# Patient Record
Sex: Male | Born: 1990 | Race: Black or African American | Hispanic: No | Smoking: Current every day smoker
Health system: Southern US, Community
[De-identification: ages and names within clinical notes are randomized; demographics above are authoritative.]

## PROBLEM LIST (undated history)

## (undated) DIAGNOSIS — R51 Headache: Secondary | ICD-10-CM

## (undated) DIAGNOSIS — D759 Disease of blood and blood-forming organs, unspecified: Secondary | ICD-10-CM

## (undated) DIAGNOSIS — R569 Unspecified convulsions: Secondary | ICD-10-CM

## (undated) HISTORY — PX: HERNIA REPAIR: SHX51

---

## 2010-09-26 ENCOUNTER — Emergency Department (HOSPITAL_COMMUNITY)
Admission: EM | Admit: 2010-09-26 | Discharge: 2010-09-26 | Disposition: A | Discharge status: Home / Self Care | Payer: Self-pay | Attending: Emergency Medicine | Admitting: Emergency Medicine

## 2010-09-26 ENCOUNTER — Emergency Department (HOSPITAL_COMMUNITY): Payer: Self-pay

## 2010-09-26 DIAGNOSIS — S60219A Contusion of unspecified wrist, initial encounter: Secondary | ICD-10-CM | POA: Insufficient documentation

## 2010-09-26 DIAGNOSIS — W2209XA Striking against other stationary object, initial encounter: Secondary | ICD-10-CM | POA: Insufficient documentation

## 2013-10-05 ENCOUNTER — Encounter (HOSPITAL_COMMUNITY): Payer: Self-pay | Admitting: Emergency Medicine

## 2013-10-05 ENCOUNTER — Emergency Department (HOSPITAL_COMMUNITY)
Admission: EM | Admit: 2013-10-05 | Discharge: 2013-10-06 | Disposition: A | Discharge status: Home / Self Care | Payer: Self-pay | Attending: Emergency Medicine | Admitting: Emergency Medicine

## 2013-10-05 ENCOUNTER — Emergency Department (HOSPITAL_COMMUNITY): Payer: Self-pay

## 2013-10-05 DIAGNOSIS — IMO0002 Reserved for concepts with insufficient information to code with codable children: Secondary | ICD-10-CM | POA: Insufficient documentation

## 2013-10-05 DIAGNOSIS — S61509A Unspecified open wound of unspecified wrist, initial encounter: Secondary | ICD-10-CM | POA: Insufficient documentation

## 2013-10-05 DIAGNOSIS — Y9289 Other specified places as the place of occurrence of the external cause: Secondary | ICD-10-CM | POA: Insufficient documentation

## 2013-10-05 DIAGNOSIS — S61409A Unspecified open wound of unspecified hand, initial encounter: Secondary | ICD-10-CM | POA: Insufficient documentation

## 2013-10-05 DIAGNOSIS — F17200 Nicotine dependence, unspecified, uncomplicated: Secondary | ICD-10-CM | POA: Insufficient documentation

## 2013-10-05 DIAGNOSIS — Y9389 Activity, other specified: Secondary | ICD-10-CM | POA: Insufficient documentation

## 2013-10-05 DIAGNOSIS — S61411A Laceration without foreign body of right hand, initial encounter: Secondary | ICD-10-CM

## 2013-10-05 DIAGNOSIS — Z880 Allergy status to penicillin: Secondary | ICD-10-CM | POA: Insufficient documentation

## 2013-10-05 MED ORDER — FENTANYL CITRATE 0.05 MG/ML IJ SOLN
INTRAMUSCULAR | Status: AC
  Administered 2013-10-05: 50 ug
  Filled 2013-10-05: qty 2

## 2013-10-05 MED ORDER — CEFAZOLIN SODIUM 1-5 GM-% IV SOLN
INTRAVENOUS | Status: AC
  Administered 2013-10-05: 1000 mg
  Filled 2013-10-05: qty 50

## 2013-10-05 MED ORDER — LIDOCAINE HCL (PF) 1 % IJ SOLN
INTRAMUSCULAR | Status: AC
  Administered 2013-10-05: 5 mL
  Filled 2013-10-05: qty 10

## 2013-10-05 MED ORDER — LIDOCAINE HCL (PF) 1 % IJ SOLN
INTRAMUSCULAR | Status: AC
  Administered 2013-10-05: 5 mL
  Filled 2013-10-05: qty 5

## 2013-10-05 NOTE — ED Notes (Signed)
No bleeding noted through gauze dressing at this time.

## 2013-10-05 NOTE — ED Notes (Signed)
Patient has laceration noted to right palm of hand and right wrist. Bleeding from site at this time. Pressure applied to areas to control bleeding. Dr Adriana Simasook at bedside.

## 2013-10-05 NOTE — ED Notes (Signed)
Pt's rt hand went through glass. Bleeding controlled at this time.

## 2013-10-05 NOTE — ED Provider Notes (Signed)
CSN: [id number]     Arrival date & time 10/05/13  2111 History  This chart was scribed for Donnetta HutchingBrian Tykeisha Peer, MD by Blanchard KelchNicole Curnes, ED Scribe. The patient was seen in room APA01/APA01. Patient's care was started at 9:54 PM.    Chief Complaint  Patient presents with  . Extremity Laceration     Patient is a 23 y.o. male presenting with skin laceration. The history is provided by the patient. No language interpreter was used.  Laceration   HPI Comments:Level 5 caveat for urgent need for intervention Tanor Lanier PrudeBolden is a 23 y.o. male who presents to the Emergency Department due to multiple lacerations on his right hand and wrist that occurred after he punched through a glass cabinet. The lacerations are actively bleeding with some areas gushing. He has moderate, constant pain to the affected hand. He reports normal ROM in his right thumb. He denies numbness. He denies any past pertinent medical history. He is up to date on his tetanus vaccination.   History reviewed. No pertinent past medical history. Past Surgical History  Procedure Laterality Date  . Hernia repair     History reviewed. No pertinent family history. History  Substance Use Topics  . Smoking status: Current Every Day Smoker    Types: Cigarettes  . Smokeless tobacco: Not on file  . Alcohol Use: Yes    Review of Systems  Unable to perform ROS: Acuity of condition   A complete 10 system review of systems was obtained and all systems are negative except as noted in the HPI and PMH.     Allergies  Penicillins  Home Medications  No current outpatient prescriptions on file. Triage Vitals: BP 124/75  Pulse 95  Temp(Src) 98.3 F (36.8 C)  Resp 18  Ht 5\' 9"  (1.753 m)  Wt 170 lb (77.111 kg)  BMI 25.09 kg/m2  SpO2 97%  Physical Exam  Nursing note and vitals reviewed. Constitutional: He is oriented to person, place, and time. He appears well-developed and well-nourished. No distress.  HENT:  Head: Normocephalic and  atraumatic.  Eyes: EOM are normal.  Neck: Neck supple. No tracheal deviation present.  Cardiovascular: Normal rate.   Pulmonary/Chest: Effort normal. No respiratory distress.  Musculoskeletal:  Right hand: 3 lacerations on the palmar aspect of hand and wrist.  Gross neurovascular intact.  Neurological: He is alert and oriented to person, place, and time.  Skin: Skin is warm and dry.  Psychiatric: He has a normal mood and affect. His behavior is normal.    ED Course  Procedures (including critical care time)  DIAGNOSTIC STUDIES: Oxygen Saturation is 97% on room air, adequate by my interpretation.    COORDINATION OF CARE: 9:57 PM -Will repair lacerations. Patient verbalizes understanding and agrees with treatment plan.  LACERATION REPAIR PROCEDURE NOTE The patient's identification was confirmed and consent was obtained. This procedure was performed by Donnetta HutchingBrian Candiss Galeana, MD at 10:00 PM. Site: right hand Sterile procedures observed Anesthetic used (type and amt): 1% xylo 10 cc Suture type/size:3-0 prolene, 4-0 nylon Length:  3 lacerations;   Total length 8.0 cm # of Sutures: uncertain Technique:simple interrupted Complexity:  complex Antibx ointment applied:  none Tetanus UTD Site anesthetized, irrigated with NS, explored without evidence of foreign body, wound well approximated, site covered with dry, sterile dressing.  Patient tolerated procedure well without complications. Instructions for care discussed verbally and patient provided with additional written instructions for homecare and f/u.  Wound was gushing blood.  Pressure applied manually and with tourniquet.  Sutures applied to control bleeding.   Labs Review Labs Reviewed - No data to display Imaging Review Dg Hand 2 View Right  10/05/2013   CLINICAL DATA:  Laceration right hand.  EXAM: RIGHT HAND - 2 VIEW  COMPARISON:  None.  FINDINGS: Multiple skin defects are seen about the base of the first metacarpal and distal  radius consistent with lacerations. No fracture, foreign body or dislocation is identified.  IMPRESSION: Lacerations without fracture or foreign body.   Electronically Signed   By: Drusilla Kanner M.D.   On: 10/05/2013 22:28     EKG Interpretation None     CRITICAL CARE Performed by: Donnetta Hutching Total critical care time:45 Critical care time was exclusive of separately billable procedures and treating other patients. Critical care was necessary to treat or prevent imminent or life-threatening deterioration. Critical care was time spent personally by me on the following activities: development of treatment plan with patient and/or surrogate as well as nursing, discussions with consultants, evaluation of patient's response to treatment, examination of patient, obtaining history from patient or surrogate, ordering and performing treatments and interventions, ordering and review of laboratory studies, ordering and review of radiographic studies, pulse oximetry and re-evaluation of patient's condition. MDM   Final diagnoses:  None    Patient was spurting blood from his hand laceration. Tourniquet and pressure applied. Gross neurological exam normal. Pain medicine, IV Ancef, local infiltration with Xylocaine.  Repair with Prolene and nylon suture.  Discussed with Dr. Melvyn Novas.   He will recheck patient on Friday.  I personally performed the services described in this documentation, which was scribed in my presence. The recorded information has been reviewed and is accurate.    Donnetta Hutching, MD 10/06/13 [phone]

## 2013-10-05 NOTE — ED Notes (Signed)
Patient hand and wrist bandaged with xeroform with vaseline and sterile gauze, wrapped with sterile dressing by Dr. Adriana Simasook. Right hand and arm kept elevated above the head to control bleeding.

## 2013-10-06 MED ORDER — PROMETHAZINE HCL 25 MG PO TABS: 25.0000 mg | ORAL_TABLET | Freq: Four times a day (QID) | ORAL | Status: DC | PRN

## 2013-10-06 MED ORDER — MORPHINE SULFATE 4 MG/ML IJ SOLN
4.0000 mg | Freq: Once | INTRAMUSCULAR | Status: AC
  Administered 2013-10-06: 4 mg via INTRAVENOUS
  Filled 2013-10-06: qty 1

## 2013-10-06 MED ORDER — ONDANSETRON HCL 4 MG/2ML IJ SOLN
4.0000 mg | Freq: Once | INTRAMUSCULAR | Status: AC
  Administered 2013-10-06: 4 mg via INTRAVENOUS
  Filled 2013-10-06: qty 2

## 2013-10-06 MED ORDER — GI COCKTAIL ~~LOC~~
ORAL | Status: AC
  Filled 2013-10-06: qty 30

## 2013-10-06 MED ORDER — OXYCODONE-ACETAMINOPHEN 5-325 MG PO TABS: 2.0000 | ORAL_TABLET | ORAL | Status: DC | PRN

## 2013-10-06 MED ORDER — OXYCODONE-ACETAMINOPHEN 5-325 MG PO TABS
2.0000 | ORAL_TABLET | ORAL | Status: DC | PRN
Start: 2013-10-06 — End: 2013-10-10

## 2013-10-06 NOTE — Discharge Instructions (Signed)
Keep hand elevated. Prescriptions for pain and nausea medicine. Keep dressing on. Call the orthopedic Dr. tomorrow for an appointment Friday. Remind the office staff that I spoke to the Hydrographic surveyorhand surgeon.

## 2013-10-10 ENCOUNTER — Encounter (HOSPITAL_BASED_OUTPATIENT_CLINIC_OR_DEPARTMENT_OTHER): Payer: Self-pay | Admitting: *Deleted

## 2013-10-12 ENCOUNTER — Ambulatory Visit (HOSPITAL_BASED_OUTPATIENT_CLINIC_OR_DEPARTMENT_OTHER): Admission: RE | Admit: 2013-10-12 | Payer: Self-pay | Source: Ambulatory Visit | Admitting: Orthopedic Surgery

## 2013-10-12 ENCOUNTER — Encounter (HOSPITAL_COMMUNITY): Payer: Self-pay | Admitting: *Deleted

## 2013-10-12 HISTORY — DX: Headache: R51

## 2013-10-12 HISTORY — DX: Disease of blood and blood-forming organs, unspecified: D75.9

## 2013-10-12 SURGERY — NERVE AND TENDON REPAIR
Anesthesia: General | Laterality: Right

## 2013-10-12 MED ORDER — MIDAZOLAM HCL 2 MG/2ML IJ SOLN: 1.0000 mg | INTRAMUSCULAR | Status: DC | PRN

## 2013-10-12 MED ORDER — LACTATED RINGERS IV SOLN: INTRAVENOUS | Status: DC

## 2013-10-12 MED ORDER — FENTANYL CITRATE 0.05 MG/ML IJ SOLN: 50.0000 ug | INTRAMUSCULAR | Status: DC | PRN

## 2013-10-13 ENCOUNTER — Ambulatory Visit (HOSPITAL_COMMUNITY): Payer: Self-pay | Admitting: Anesthesiology

## 2013-10-13 ENCOUNTER — Encounter (HOSPITAL_COMMUNITY): Payer: Self-pay | Admitting: Anesthesiology

## 2013-10-13 ENCOUNTER — Ambulatory Visit (HOSPITAL_COMMUNITY)
Admission: RE | Admit: 2013-10-13 | Discharge: 2013-10-13 | Disposition: A | Discharge status: Home / Self Care | Payer: Self-pay | Source: Ambulatory Visit | Attending: Orthopedic Surgery | Admitting: Orthopedic Surgery

## 2013-10-13 ENCOUNTER — Encounter (HOSPITAL_COMMUNITY)
Admission: RE | Disposition: A | Discharge status: Home / Self Care | Payer: Self-pay | Source: Ambulatory Visit | Attending: Orthopedic Surgery

## 2013-10-13 DIAGNOSIS — D573 Sickle-cell trait: Secondary | ICD-10-CM | POA: Insufficient documentation

## 2013-10-13 DIAGNOSIS — S5420XA Injury of radial nerve at forearm level, unspecified arm, initial encounter: Secondary | ICD-10-CM | POA: Insufficient documentation

## 2013-10-13 DIAGNOSIS — S61509A Unspecified open wound of unspecified wrist, initial encounter: Secondary | ICD-10-CM | POA: Insufficient documentation

## 2013-10-13 DIAGNOSIS — S55109A Unspecified injury of radial artery at forearm level, unspecified arm, initial encounter: Secondary | ICD-10-CM | POA: Insufficient documentation

## 2013-10-13 DIAGNOSIS — S66909A Unspecified injury of unspecified muscle, fascia and tendon at wrist and hand level, unspecified hand, initial encounter: Principal | ICD-10-CM | POA: Insufficient documentation

## 2013-10-13 DIAGNOSIS — F17200 Nicotine dependence, unspecified, uncomplicated: Secondary | ICD-10-CM | POA: Insufficient documentation

## 2013-10-13 DIAGNOSIS — S61209A Unspecified open wound of unspecified finger without damage to nail, initial encounter: Secondary | ICD-10-CM | POA: Insufficient documentation

## 2013-10-13 DIAGNOSIS — G43909 Migraine, unspecified, not intractable, without status migrainosus: Secondary | ICD-10-CM | POA: Insufficient documentation

## 2013-10-13 DIAGNOSIS — Y289XXA Contact with unspecified sharp object, undetermined intent, initial encounter: Secondary | ICD-10-CM | POA: Insufficient documentation

## 2013-10-13 HISTORY — PX: NERVE AND TENDON REPAIR: SHX5693

## 2013-10-13 SURGERY — NERVE AND TENDON REPAIR
Anesthesia: General | Laterality: Right

## 2013-10-13 MED ORDER — ARTIFICIAL TEARS OP OINT
TOPICAL_OINTMENT | OPHTHALMIC | Status: DC | PRN
  Administered 2013-10-13: 1 via OPHTHALMIC

## 2013-10-13 MED ORDER — OXYCODONE HCL 5 MG PO TABS
ORAL_TABLET | ORAL | Status: AC
  Filled 2013-10-13: qty 1

## 2013-10-13 MED ORDER — OXYCODONE HCL 5 MG PO TABS
5.0000 mg | ORAL_TABLET | Freq: Once | ORAL | Status: AC | PRN
  Administered 2013-10-13: 5 mg via ORAL

## 2013-10-13 MED ORDER — CLINDAMYCIN PHOSPHATE 900 MG/50ML IV SOLN
INTRAVENOUS | Status: AC
  Administered 2013-10-13: 900 mg via INTRAVENOUS
  Filled 2013-10-13: qty 50

## 2013-10-13 MED ORDER — MIDAZOLAM HCL 2 MG/2ML IJ SOLN
INTRAMUSCULAR | Status: AC
  Filled 2013-10-13: qty 2

## 2013-10-13 MED ORDER — SODIUM CHLORIDE 0.9 % IR SOLN
Status: DC | PRN
  Administered 2013-10-13: 17:00:00

## 2013-10-13 MED ORDER — FENTANYL CITRATE 0.05 MG/ML IJ SOLN
INTRAMUSCULAR | Status: AC
  Filled 2013-10-13: qty 5

## 2013-10-13 MED ORDER — CLINDAMYCIN PHOSPHATE 900 MG/50ML IV SOLN: 900.0000 mg | INTRAVENOUS | Status: DC

## 2013-10-13 MED ORDER — HEPARIN SODIUM (PORCINE) 1000 UNIT/ML IJ SOLN
INTRAMUSCULAR | Status: AC
  Filled 2013-10-13: qty 1

## 2013-10-13 MED ORDER — HYDROMORPHONE HCL PF 1 MG/ML IJ SOLN
INTRAMUSCULAR | Status: AC
  Filled 2013-10-13: qty 1

## 2013-10-13 MED ORDER — LACTATED RINGERS IV SOLN
INTRAVENOUS | Status: DC
  Administered 2013-10-13 (×2): via INTRAVENOUS

## 2013-10-13 MED ORDER — PROMETHAZINE HCL 25 MG/ML IJ SOLN: 6.2500 mg | INTRAMUSCULAR | Status: DC | PRN

## 2013-10-13 MED ORDER — FENTANYL CITRATE 0.05 MG/ML IJ SOLN
INTRAMUSCULAR | Status: DC | PRN
  Administered 2013-10-13 (×6): 50 ug via INTRAVENOUS

## 2013-10-13 MED ORDER — ONDANSETRON HCL 4 MG/2ML IJ SOLN
INTRAMUSCULAR | Status: DC | PRN
  Administered 2013-10-13: 4 mg via INTRAVENOUS

## 2013-10-13 MED ORDER — LIDOCAINE HCL (CARDIAC) 20 MG/ML IV SOLN
INTRAVENOUS | Status: AC
  Filled 2013-10-13: qty 5

## 2013-10-13 MED ORDER — OXYCODONE HCL 5 MG PO TABS: 5.0000 mg | ORAL_TABLET | ORAL | Status: DC | PRN

## 2013-10-13 MED ORDER — BUPIVACAINE HCL (PF) 0.25 % IJ SOLN
INTRAMUSCULAR | Status: DC | PRN
  Administered 2013-10-13: 10 mL

## 2013-10-13 MED ORDER — ONDANSETRON HCL 4 MG/2ML IJ SOLN
INTRAMUSCULAR | Status: AC
  Filled 2013-10-13: qty 2

## 2013-10-13 MED ORDER — MIDAZOLAM HCL 5 MG/5ML IJ SOLN
INTRAMUSCULAR | Status: DC | PRN
  Administered 2013-10-13: 2 mg via INTRAVENOUS

## 2013-10-13 MED ORDER — CHLORHEXIDINE GLUCONATE 4 % EX LIQD
60.0000 mL | Freq: Once | CUTANEOUS | Status: DC
  Filled 2013-10-13: qty 60

## 2013-10-13 MED ORDER — OXYCODONE HCL 5 MG/5ML PO SOLN: 5.0000 mg | Freq: Once | ORAL | Status: AC | PRN

## 2013-10-13 MED ORDER — DOCUSATE SODIUM 100 MG PO CAPS: 100.0000 mg | ORAL_CAPSULE | Freq: Two times a day (BID) | ORAL | Status: DC

## 2013-10-13 MED ORDER — PROPOFOL 10 MG/ML IV BOLUS
INTRAVENOUS | Status: DC | PRN
  Administered 2013-10-13: 200 mg via INTRAVENOUS

## 2013-10-13 MED ORDER — LIDOCAINE HCL (CARDIAC) 20 MG/ML IV SOLN
INTRAVENOUS | Status: DC | PRN
  Administered 2013-10-13: 100 mg via INTRAVENOUS

## 2013-10-13 MED ORDER — BUPIVACAINE HCL (PF) 0.25 % IJ SOLN
INTRAMUSCULAR | Status: AC
  Filled 2013-10-13: qty 30

## 2013-10-13 MED ORDER — HYDROMORPHONE HCL PF 1 MG/ML IJ SOLN
0.2500 mg | INTRAMUSCULAR | Status: DC | PRN
  Administered 2013-10-13: 0.5 mg via INTRAVENOUS

## 2013-10-13 MED ORDER — ARTIFICIAL TEARS OP OINT
TOPICAL_OINTMENT | OPHTHALMIC | Status: AC
  Filled 2013-10-13: qty 3.5

## 2013-10-13 MED FILL — Oxycodone w/ Acetaminophen Tab 5-325 MG: ORAL | Qty: 6 | Status: AC

## 2013-10-13 SURGICAL SUPPLY — 63 items
BAG DECANTER FOR FLEXI CONT (MISCELLANEOUS) ×3 IMPLANT
BANDAGE ELASTIC 3 VELCRO ST LF (GAUZE/BANDAGES/DRESSINGS) ×3 IMPLANT
BANDAGE ELASTIC 4 VELCRO ST LF (GAUZE/BANDAGES/DRESSINGS) ×3 IMPLANT
BANDAGE GAUZE ELAST BULKY 4 IN (GAUZE/BANDAGES/DRESSINGS) ×3 IMPLANT
BLADE SURG 15 STRL LF DISP TIS (BLADE) IMPLANT
BLADE SURG 15 STRL SS (BLADE)
BNDG ESMARK 4X9 LF (GAUZE/BANDAGES/DRESSINGS) ×3 IMPLANT
BNDG GAUZE ELAST 4 BULKY (GAUZE/BANDAGES/DRESSINGS) ×3 IMPLANT
CORDS BIPOLAR (ELECTRODE) ×3 IMPLANT
COVER MAYO STAND STRL (DRAPES) ×3 IMPLANT
CUFF TOURNIQUET SINGLE 18IN (TOURNIQUET CUFF) IMPLANT
DRAPE SURG 17X23 STRL (DRAPES) ×3 IMPLANT
DRSG ADAPTIC 3X8 NADH LF (GAUZE/BANDAGES/DRESSINGS) ×3 IMPLANT
DRSG EMULSION OIL 3X3 NADH (GAUZE/BANDAGES/DRESSINGS) ×3 IMPLANT
GAUZE SPONGE 4X4 16PLY XRAY LF (GAUZE/BANDAGES/DRESSINGS) ×3 IMPLANT
GLOVE BIOGEL PI IND STRL 8 (GLOVE) ×1 IMPLANT
GLOVE BIOGEL PI IND STRL 8.5 (GLOVE) ×1 IMPLANT
GLOVE BIOGEL PI INDICATOR 8 (GLOVE) ×2
GLOVE BIOGEL PI INDICATOR 8.5 (GLOVE) ×2
GLOVE BIOGEL PI ORTHO PRO SZ8 (GLOVE) ×2
GLOVE PI ORTHO PRO STRL SZ8 (GLOVE) ×1 IMPLANT
GLOVE SURG ORTHO 8.0 STRL STRW (GLOVE) ×3 IMPLANT
GOWN STRL REUS W/ TWL XL LVL3 (GOWN DISPOSABLE) ×1 IMPLANT
GOWN STRL REUS W/TWL 2XL LVL3 (GOWN DISPOSABLE) ×3 IMPLANT
GOWN STRL REUS W/TWL LRG LVL4 (GOWN DISPOSABLE) ×3 IMPLANT
GOWN STRL REUS W/TWL XL LVL3 (GOWN DISPOSABLE) ×2
KIT BASIN OR (CUSTOM PROCEDURE TRAY) ×3 IMPLANT
LOOP VESSEL MAXI BLUE (MISCELLANEOUS) IMPLANT
LOOP VESSEL MINI RED (MISCELLANEOUS) IMPLANT
NEEDLE HYPO 25GX1X1/2 BEV (NEEDLE) ×3 IMPLANT
NEEDLE HYPO 25X1 1.5 SAFETY (NEEDLE) IMPLANT
NERVE PROTECTOR NEURAWRAP 3MM (Tissue) ×2 IMPLANT
NS IRRIG 1000ML POUR BTL (IV SOLUTION) ×3 IMPLANT
PACK ORTHO EXTREMITY (CUSTOM PROCEDURE TRAY) ×3 IMPLANT
PAD CAST 4YDX4 CTTN HI CHSV (CAST SUPPLIES) ×2 IMPLANT
PADDING CAST ABS 4INX4YD NS (CAST SUPPLIES) ×2
PADDING CAST ABS COTTON 4X4 ST (CAST SUPPLIES) ×1 IMPLANT
PADDING CAST COTTON 4X4 STRL (CAST SUPPLIES) ×4
PROTECTOR NRV 4X32 PEEL NERWRP (Tissue) ×1 IMPLANT
PRTC NRV 4X32 PEEL NEURAWRAP (Tissue) ×1 IMPLANT
SPEAR EYE SURG WECK-CEL (MISCELLANEOUS) IMPLANT
SPLINT FIBERGLASS 4X30 (CAST SUPPLIES) ×3 IMPLANT
SPONGE GAUZE 4X4 12PLY (GAUZE/BANDAGES/DRESSINGS) ×3 IMPLANT
SUCTION FRAZIER TIP 10 FR DISP (SUCTIONS) IMPLANT
SUT ETHIBOND 3-0 V-5 (SUTURE) IMPLANT
SUT ETHILON 4 0 PS 2 18 (SUTURE) IMPLANT
SUT ETHILON 7 0 P 6 18 (SUTURE) ×3 IMPLANT
SUT ETHILON 8 0 TG100 8 (SUTURE) ×3 IMPLANT
SUT MERSILENE 4 0 P 3 (SUTURE) IMPLANT
SUT PROLENE 3 0 PS 2 (SUTURE) ×9 IMPLANT
SUT PROLENE 4 0 PS 2 18 (SUTURE) IMPLANT
SUT PROLENE 7 0 DA (SUTURE) ×3 IMPLANT
SUT VIC AB 2-0 SH 27 (SUTURE)
SUT VIC AB 2-0 SH 27XBRD (SUTURE) IMPLANT
SUT VIC AB 3-0 PS2 18 (SUTURE) ×6
SUT VIC AB 3-0 PS2 18XBRD (SUTURE) ×3 IMPLANT
SUT VICRYL 4-0 PS2 18IN ABS (SUTURE) ×3 IMPLANT
SUT VICRYL RAPIDE 4/0 PS 2 (SUTURE) ×9 IMPLANT
SYR CONTROL 10ML LL (SYRINGE) ×3 IMPLANT
TUBE CONNECTING 12'X1/4 (SUCTIONS)
TUBE CONNECTING 12X1/4 (SUCTIONS) IMPLANT
UNDERPAD 30X30 INCONTINENT (UNDERPADS AND DIAPERS) ×3 IMPLANT
WATER STERILE IRR 1000ML POUR (IV SOLUTION) ×3 IMPLANT

## 2013-10-13 NOTE — Brief Op Note (Signed)
10/13/2013  4:39 PM  PATIENT:  Tony Hobbs  23 y.o. male  PRE-OPERATIVE DIAGNOSIS:  RIGHT WRIST LACERATION WITH TENDON NERVE INVOLVEMENT  POST-OPERATIVE DIAGNOSIS:  SAME  PROCEDURE:  Procedure(s): RIGHT WRIST/THUMB WOUND EXPLORATION AND REPAIR AS INDICATED (Right)  SURGEON:  Surgeon(s) and Role:    * Sharma CovertFred W Collen Vincent, MD - Primary  PHYSICIAN ASSISTANT:   ASSISTANTS: none   ANESTHESIA:   general  EBL:     BLOOD ADMINISTERED:none  DRAINS: none   LOCAL MEDICATIONS USED:  MARCAINE     SPECIMEN:  No Specimen  DISPOSITION OF SPECIMEN:  N/A  COUNTS:  YES  TOURNIQUET:    DICTATIO: [id number]: [id number]  PLAN OF CARE: Discharge to home after PACU  PATIENT DISPOSITION:  PACU - hemodynamically stable.   Delay start of Pharmacological VTE agent (>24hrs) due to surgical blood loss or risk of bleeding: not applicable

## 2013-10-13 NOTE — Transfer of Care (Signed)
Immediate Anesthesia Transfer of Care Note  Patient: Tony Hobbs  Procedure(s) Performed: Procedure(s): RIGHT WRIST/THUMB WOUND EXPLORATION AND REPAIR AS INDICATED (Right)  Patient Location: PACU  Anesthesia Type:General  Level of Consciousness: awake, alert  and oriented  Airway & Oxygen Therapy: Patient Spontanous Breathing and Patient connected to nasal cannula oxygen  Post-op Assessment: Report given to PACU RN  Post vital signs: Reviewed and stable  Complications: No apparent anesthesia complications

## 2013-10-13 NOTE — Anesthesia Preprocedure Evaluation (Addendum)
Anesthesia Evaluation  Patient identified by MRN, date of birth, ID band Patient awake    Reviewed: Allergy & Precautions, H&P , NPO status , Patient's Chart, lab work & pertinent test results  History of Anesthesia Complications Negative for: history of anesthetic complications  Airway Mallampati: I  Neck ROM: Full    Dental  (+) Teeth Intact, Chipped,    Pulmonary Current Smoker,  breath sounds clear to auscultation        Cardiovascular negative cardio ROS  Rhythm:Regular Rate:Normal     Neuro/Psych    GI/Hepatic negative GI ROS, Neg liver ROS,   Endo/Other    Renal/GU      Musculoskeletal   Abdominal   Peds  Hematology   Anesthesia Other Findings   Reproductive/Obstetrics                          Anesthesia Physical Anesthesia Plan  ASA: II  Anesthesia Plan: General   Post-op Pain Management:    Induction: Intravenous  Airway Management Planned: LMA  Additional Equipment:   Intra-op Plan:   Post-operative Plan: Extubation in OR  Informed Consent: I have reviewed the patients History and Physical, chart, labs and discussed the procedure including the risks, benefits and alternatives for the proposed anesthesia with the patient or authorized representative who has indicated his/her understanding and acceptance.   Dental advisory given  Plan Discussed with: CRNA and Surgeon  Anesthesia Plan Comments:         Anesthesia Quick Evaluation

## 2013-10-13 NOTE — H&P (Signed)
Tony Hobbs is an 23 y.o. male.   Chief Complaint: right wrist laceration HPI: pt with injury to right wrist/ put wrist through glass window Presented to office with open wounds to right hand Pt here for surgery  No previous surgery to right wrist  Past Medical History  Diagnosis Date  . Headache(784.0)     migraines  . Blood dyscrasia     sickle cell trait    Past Surgical History  Procedure Laterality Date  . Hernia repair      right    History reviewed. No pertinent family history. Social History:  reports that he has been smoking Cigarettes.  He has been smoking about 1.00 pack per day. He has never used smokeless tobacco. He reports that he drinks alcohol. He reports that he uses illicit drugs (Marijuana).  Allergies:  Allergies  Allergen Reactions  . Penicillins Rash    Medications Prior to Admission  Medication Sig Dispense Refill  . oxyCODONE-acetaminophen (PERCOCET/ROXICET) 5-325 MG per tablet Take 1 tablet by mouth every 6 (six) hours as needed for moderate pain or severe pain.      . promethazine (PHENERGAN) 25 MG tablet Take 25 mg by mouth every 6 (six) hours as needed for nausea or vomiting.        No results found for this or any previous visit (from the past 48 hour(s)). No results found.  ROS  NO RECENT ILLNESSES OR HOSPITALIZATIONS   Blood pressure 131/63, pulse 76, temperature 98.2 F (36.8 C), resp. rate 18, height 5\' 9"  (1.753 m), weight 68.493 kg (151 lb), SpO2 97.00%. Physical Exam  General Appearance:  Alert, cooperative, no distress, appears stated age  Head:  Normocephalic, without obvious abnormality, atraumatic  Eyes:  Pupils equal, conjunctiva/corneas clear,         Throat: Lips, mucosa, and tongue normal; teeth and gums normal  Neck: No visible masses     Lungs:   respirations unlabored  Chest Wall:  No tenderness or deformity  Heart:  Regular rate and rhythm,  Abdomen:   Soft, non-tender,         Extremities: RIGHT WRIST:  SPLINT INTACT ABLE TO WIGGLE FINGERS, FINGERS WARM WELL PERFUSED GOOD MOBILITY TO INDEX/LONG/RING/SMALL  Pulses: 2+ and symmetric  Skin: Skin color, texture, turgor normal, no rashes or lesions     Neurologic: Normal    Assessment/Plan RIGHT WRIST LACERATION WITH NERVE/TENDON INVOLVEMENT  RIGHT WRIST WOUND EXPLORATION AND REPAIR AS INDICATED  R/B/A DISCUSSED WITH PT IN OFFICE.  PT VOICED UNDERSTANDING OF PLAN CONSENT SIGNED DAY OF SURGERY PT SEEN AND EXAMINED PRIOR TO OPERATIVE PROCEDURE/DAY OF SURGERY SITE MARKED. QUESTIONS ANSWERED [facility] FO[facility]GERY  Sharma CovertORTMANN,Mynor Witkop W 10/13/2013, 4:36 PM

## 2013-10-13 NOTE — Anesthesia Procedure Notes (Signed)
Procedure Name: LMA Insertion Date/Time: 10/13/2013 4:53 PM Performed by: Jefm MilesENNIE, Korrine Sicard E Pre-anesthesia Checklist: Patient identified, Emergency Drugs available, Suction available, Patient being monitored and Timeout performed Patient Re-evaluated:Patient Re-evaluated prior to inductionOxygen Delivery Method: Circle system utilized Preoxygenation: Pre-oxygenation with 100% oxygen Intubation Type: IV induction Ventilation: Mask ventilation without difficulty LMA: LMA inserted LMA Size: 4.0 Number of attempts: 1 Placement Confirmation: positive ETCO2 and breath sounds checked- equal and bilateral Tube secured with: Tape Dental Injury: Teeth and Oropharynx as per pre-operative assessment

## 2013-10-13 NOTE — Preoperative (Signed)
Beta Blockers   Reason not to administer Beta Blockers:Not Applicable 

## 2013-10-13 NOTE — Discharge Instructions (Signed)
KEEP BANDAGE CLEAN AND DRY CALL OFFICE FOR F/U APPT [phone] in 2 1/2 weeks KEEP HAND ELEVATED ABOVE HEART OK TO APPLY ICE TO OPERATIVE AREA CONTACT OFFICE IF ANY WORSENING PAIN OR CONCERNS.

## 2013-10-13 NOTE — Anesthesia Postprocedure Evaluation (Signed)
  Anesthesia Post-op Note  Patient: Tony Hobbs  Procedure(s) Performed: Procedure(s): RIGHT WRIST/THUMB WOUND EXPLORATION AND REPAIR AS INDICATED (Right)  Patient Location: PACU  Anesthesia Type:General  Level of Consciousness: awake, alert , oriented and patient cooperative  Airway and Oxygen Therapy: Patient Spontanous Breathing  Post-op Pain: none  Post-op Assessment: Post-op Vital signs reviewed, Patient's Cardiovascular Status Stable, Respiratory Function Stable, Patent Airway, No signs of Nausea or vomiting and Pain level controlled  Post-op Vital Signs: Reviewed and stable  Complications: No apparent anesthesia complications

## 2013-10-14 NOTE — Op Note (Signed)
NAMEMALIKYE, REPPOND NO.:  [id number]  MEDICAL RECORD NO.:  [id number]  LOCATION:  [facility]                         FACILITY:  [facility]  PHYSICIAN:  Madelynn Done, MD  DATE OF BIRTH:  02-16-1991  DATE OF PROCEDURE:  10/13/2013 DATE OF DISCHARGE:  10/13/2013                              OPERATIVE REPORT   PREOPERATIVE DIAGNOSES: 1. Right wrist traumatic laceration, 6.5 cm right wrist region in the     thenar eminence. 2. Right wrist traumatic laceration, 4 cm.  POSTOPERATIVE DIAGNOSES: 1. Right wrist traumatic laceration, 6.5 cm right wrist region in the     thenar eminence. 2. Right wrist traumatic laceration, 4 cm.  ATTENDING PHYSICIAN:  Madelynn Done, MD who scrubbed and present for the entire procedure.  ASSISTANT SURGEON:  None.  SURGICAL PROCEDURE: 1. Repair of peripheral artery, radial artery, right wrist. 2. Repair of the thenar musculature, opponens pollicis. 3. Repair, right thumb adductor pollicis. 4. Right thumb repair of flexor pollicis brevis. 5. Right wrist joint arthrotomy and exploration and debridement. 6. Right wrist traumatic laceration repair, 6.5 cm. 7. Right wrist traumatic laceration repair, 4 cm. 8. Right wrist superficial branch of the radial nerve, peripheral, and     sensory nerve repair. 9. Right wrist superficial branch of the radial nerve, nerve wrap     application.  SURGICAL INDICATIONS:  Mr. Partain is a 23 year old gentleman who put his hand through a glass window sustaining the above injuries.  The patient was seen and evaluated and recommended to undergo the above procedure. Risks, benefits, and alternatives were discussed in detail with the patient.  Signed informed consent was obtained.  Risks include, but not limited to bleeding, infection, damage to nearby nerves, arteries, or tendons, loss of motion to the wrist and digits, incomplete relief of symptoms, and need for further surgical  intervention.  DESCRIPTION OF PROCEDURE:  The patient was properly identified in the preop holding area and mark with a permanent marker made in the right wrist to indicate the correct operative site.  The patient was then brought back to the operating room, placed supine on the anesthesia room table, general anesthesia was administered.  The patient tolerated this well.  A well-padded tourniquet was then placed on the right brachium, sealed with a 1000 drape.  Right upper extremity was then prepped and draped in normal sterile fashion.  Time-out was called, correct side was identified, and the procedure then begun.  Attention then turned to the right wrist where the 6-cm laceration extended both proximally and distally.  The limb was elevated and tourniquet insufflated.  Dissection was then carried down through the thenar musculature and the patient did have laceration extending all the way down to the thenar musculature, opponens pollicis, adductor, and the flexor pollicis brevis.  Careful dissection was carried all the way down through this musculature and fascial layer where the radial artery was transected.  The scaphotrapezial joint capsule had been violated through an arthrotomy of the midcarpal joint was then carried out.  Exploration and wound irrigation.  Following this, capsular closure was then carried out of the ST joint.  Following this, the radial artery  was then carefully identified both proximally and distally.  The tourniquet was then deflated.  Small dilators were then used to remove the intra-arterial layer clot.  Heparinized saline was then used to repair both sides of the vessels, there was good flow both proximally and distally.  Small vessel clamps were then placed on the radial artery and the radial artery was then repaired with 7-0 Prolene suture with a running suture. The clamps were then removed.  There was good flow through the radial artery both proximally  and distally.  After arterial repair, the remaining portion of the thenar musculature was then repaired.  The opponens flexor pollicis, adductor with the Vicryl suture repairing the fascia layer.  Once this was carried out, attention was then turned to repair the traumatic laceration.  The traumatic laceration was then repaired with 4-0 Vicryl Rapide suture.  Attention was then turned to the 4 cm laceration over the dorsal aspect of the first dorsal compartment, extended both proximally and distally.  Superficial branch of the radial nerve was was transected.  It was then carefully freed both proximally and distally.  Nerve ends were then repaired taken them back to healthy nerve fascicles and then under loupe magnification the nerve was then repaired with 7-0 nylon suture.  After epineural suture repair given the nature of the tendon, Integra nerve wrap was then applied around the repair.  The first dorsal tendons were explored and there was not any lacerations to the tendon.  Following this, the wound was then irrigated and the traumatic laceration was then repaired with 4- 0 Vicryl Rapide.  10 mL of 0.25% Marcaine infiltrated locally.  Adaptic dressing, sterile compressive bandage was applied.  The patient was then placed in a well-padded thumb spica splint, extubated, and taken to the recovery room in good condition.  POSTPROCEDURE PLAN:  The patient was discharged to home, seen back in the office in approximately 2-1/2 weeks for wound check, application of short-arm thumb spica cast for a total of 4 weeks immobilization, and prescription for a short-arm brace and then gradual use and activity.     Madelynn DoneFred W Axavier Pressley IV, MD     FWO/MEDQ  D:  10/13/2013  T:  10/14/2013  Job:  [id number]

## 2013-10-19 ENCOUNTER — Encounter (HOSPITAL_COMMUNITY): Payer: Self-pay | Admitting: Orthopedic Surgery

## 2015-10-26 ENCOUNTER — Emergency Department (HOSPITAL_COMMUNITY)
Admission: EM | Admit: 2015-10-26 | Discharge: 2015-10-27 | Disposition: A | Discharge status: Home / Self Care | Payer: BLUE CROSS/BLUE SHIELD | Attending: Emergency Medicine | Admitting: Emergency Medicine

## 2015-10-26 ENCOUNTER — Encounter (HOSPITAL_COMMUNITY): Payer: Self-pay

## 2015-10-26 DIAGNOSIS — R569 Unspecified convulsions: Secondary | ICD-10-CM | POA: Diagnosis present

## 2015-10-26 DIAGNOSIS — F17210 Nicotine dependence, cigarettes, uncomplicated: Secondary | ICD-10-CM | POA: Diagnosis not present

## 2015-10-26 NOTE — ED Notes (Signed)
Pt had a witnessed seizure at home approx  2130.  Pt has had one seizure in the past several years ago but is not on medications for same.  Pt is awake, alert, oriented.   Pt denies pain except for where he apparently bit his tongue during the seizure.

## 2015-10-27 ENCOUNTER — Emergency Department (HOSPITAL_COMMUNITY): Payer: BLUE CROSS/BLUE SHIELD

## 2015-10-27 LAB — CBC WITH DIFFERENTIAL/PLATELET
BLASTS: 0 %
Band Neutrophils: 0 %
Basophils Absolute: 0 10*3/uL (ref 0.0–0.1)
Basophils Relative: 0 %
Eosinophils Absolute: 0 10*3/uL (ref 0.0–0.7)
Eosinophils Relative: 0 %
HCT: 45.2 % (ref 39.0–52.0)
HEMOGLOBIN: 16.5 g/dL (ref 13.0–17.0)
LYMPHS PCT: 18 %
Lymphs Abs: 2.4 10*3/uL (ref 0.7–4.0)
MCH: 29.2 pg (ref 26.0–34.0)
MCHC: 36.5 g/dL — AB (ref 30.0–36.0)
MCV: 80 fL (ref 78.0–100.0)
Metamyelocytes Relative: 0 %
Monocytes Absolute: 0.3 10*3/uL (ref 0.1–1.0)
Monocytes Relative: 2 %
Myelocytes: 0 %
NEUTROS PCT: 80 %
NRBC: 0 /100{WBCs}
Neutro Abs: 10.4 10*3/uL — ABNORMAL HIGH (ref 1.7–7.7)
OTHER: 0 %
Platelets: 222 10*3/uL (ref 150–400)
Promyelocytes Absolute: 0 %
RBC: 5.65 MIL/uL (ref 4.22–5.81)
RDW: 14.1 % (ref 11.5–15.5)
WBC: 13.1 10*3/uL — AB (ref 4.0–10.5)

## 2015-10-27 LAB — COMPREHENSIVE METABOLIC PANEL
ALT: 14 U/L — ABNORMAL LOW (ref 17–63)
ANION GAP: 10 (ref 5–15)
AST: 26 U/L (ref 15–41)
Albumin: 4.7 g/dL (ref 3.5–5.0)
Alkaline Phosphatase: 69 U/L (ref 38–126)
BUN: 19 mg/dL (ref 6–20)
CHLORIDE: 103 mmol/L (ref 101–111)
CO2: 23 mmol/L (ref 22–32)
Calcium: 9 mg/dL (ref 8.9–10.3)
Creatinine, Ser: 1.08 mg/dL (ref 0.61–1.24)
GFR calc non Af Amer: >60 mL/min (ref >60)
Glucose, Bld: 96 mg/dL (ref 65–99)
Potassium: 4 mmol/L (ref 3.5–5.1)
Sodium: 136 mmol/L (ref 135–145)
Total Bilirubin: 0.5 mg/dL (ref 0.3–1.2)
Total Protein: 7.6 g/dL (ref 6.5–8.1)

## 2015-10-27 LAB — URINALYSIS, ROUTINE W REFLEX MICROSCOPIC
Bilirubin Urine: NEGATIVE
Glucose, UA: NEGATIVE mg/dL
Hgb urine dipstick: NEGATIVE
KETONES UR: NEGATIVE mg/dL
Leukocytes, UA: NEGATIVE
NITRITE: NEGATIVE
PH: 6 (ref 5.0–8.0)
Protein, ur: NEGATIVE mg/dL
SPECIFIC GRAVITY, URINE: 1.01 (ref 1.005–1.030)

## 2015-10-27 LAB — RAPID URINE DRUG SCREEN, HOSP PERFORMED
Amphetamines: NOT DETECTED
Barbiturates: NOT DETECTED
Benzodiazepines: NOT DETECTED
COCAINE: NOT DETECTED
OPIATES: NOT DETECTED
TETRAHYDROCANNABINOL: POSITIVE — AB

## 2015-10-27 LAB — CBG MONITORING, ED: Glucose-Capillary: 116 mg/dL — ABNORMAL HIGH (ref 65–99)

## 2015-10-27 MED ORDER — LACOSAMIDE 50 MG PO TABS: 50.0000 mg | ORAL_TABLET | Freq: Two times a day (BID) | ORAL | Status: DC

## 2015-10-27 MED ORDER — LACOSAMIDE 50 MG PO TABS
50.0000 mg | ORAL_TABLET | Freq: Once | ORAL | Status: AC
  Administered 2015-10-27: 50 mg via ORAL
  Filled 2015-10-27: qty 1

## 2015-10-27 NOTE — ED Notes (Signed)
Per pt and mother, pt has had a seizure tonight last was 3 years ago. First seizure was 3 years ago, but was not evaluated. 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 He does not remember the events post seizure

## 2015-10-27 NOTE — ED Notes (Signed)
Physician in to reassess pt and discuss results- pos offered and given

## 2015-10-27 NOTE — ED Notes (Signed)
Blood glucose is 116 and pt reports unable to void at this time

## 2015-10-27 NOTE — Discharge Instructions (Signed)
You should not drive at all until you have been cleared by a neurologist. Please also do not perform any other activities that may lead to an injury for yourself or others if he were to have a seizure (such as extreme sports, swimming in a pool, taking a bath, cooking alone).     Seizure, Adult A seizure is abnormal electrical activity in the brain. Seizures usually last from 30 seconds to 2 minutes. There are various types of seizures. Before a seizure, you may have a warning sensation (aura) that a seizure is about to occur. An aura may include the following symptoms:   Fear or anxiety.  Nausea.  Feeling like the room is spinning (vertigo).  Vision changes, such as seeing flashing lights or spots. Common symptoms during a seizure include:  A change in attention or behavior (altered mental status).  Convulsions with rhythmic jerking movements.  Drooling.  Rapid eye movements.  Grunting.  Loss of bladder and bowel control.  Bitter taste in the mouth.  Tongue biting. After a seizure, you may feel confused and sleepy. You may also have an injury resulting from convulsions during the seizure. HOME CARE INSTRUCTIONS   If you are given medicines, take them exactly as prescribed by your health care provider.  Keep all follow-up appointments as directed by your health care provider.  Do not swim or drive or engage in risky activity during which a seizure could cause further injury to you or others until your health care provider says it is OK.  Get adequate rest.  Teach friends and family what to do if you have a seizure. They should:  Lay you on the ground to prevent a fall.  Put a cushion under your head.  Loosen any tight clothing around your neck.  Turn you on your side. If vomiting occurs, this helps keep your airway clear.  Stay with you until you recover.  Know whether or not you need emergency care. SEEK IMMEDIATE MEDICAL CARE IF:  The seizure lasts longer  than 5 minutes.  The seizure is severe or you do not wake up immediately after the seizure.  You have an altered mental status after the seizure.  You are having more frequent or worsening seizures. Someone should drive you to the emergency department or call local emergency services (911 in [place] MAKE SURE YOU:  Understand these instructions.  Will watch your condition.  Will get help right away if you are not doing well or get worse.   This information is not intended to replace advice given to you by your health care provider. Make sure you discuss any questions you have with your health care provider.   Document Released: 07/04/2000 Document Revised: 07/28/2014 Document Reviewed: 02/16/2013 Elsevier Interactive Patient Education [employer]2016 [employer]

## 2015-10-27 NOTE — ED Provider Notes (Signed)
TIME SEEN: 12:26 AM  CHIEF COMPLAINT: Seizures  HPI: Tony Hobbs is a 25 y.o. male brought in by EMS who presents to the Emergency Department complaining of one unwitnessed seizure that occurred shortly PTA. Patient reports that he smoked marijuana, drank an energy drink before going to sleep and woke up surrounded by paramedics. Mother states that pt was asleep when she heard the pt begin to make sounds and breathing heavily for several minutes. When his sister checked on him there was "blood everywhere" from biting a tongue. She notes that his breathing was labored but that pt would not respond. She states an hx of seizures once 3 years ago but he wasn't evaluated for this previous episode. He an daily marijuana user and an occasional ETOH user. Per mother, pt is more stressed recently due to death of his grandmother and has been sleeping less. He works as a Personal assistant. Denies hx of epilepsy. Family history of seizures. Denies fevers, cough, diarrhea, any pains, numbness, weakness, and tingling. Reports drinking alcohol occasionally but no history of heavy alcohol abuse or history of seizure. No recent head injury. No history of previous head trauma. Nontender mouth, Wellbutrin. Not diabetic. Family reports blood glucose was EMS was normal. Patient states that he has been feeling normal recently.  ROS: See HPI Constitutional: no fever  Eyes: no drainage  ENT: no runny nose   Cardiovascular:  no chest pain  Resp: no SOB  GI: no vomiting GU: no dysuria Integumentary: no rash  Allergy: no hives  Musculoskeletal: no leg swelling  Neurological: no slurred speech ROS otherwise negative  PAST MEDICAL HISTORY/PAST SURGICAL HISTORY:  Past Medical History  Diagnosis Date  . Headache(784.0)     migraines  . Blood dyscrasia     sickle cell trait    MEDICATIONS:  Prior to Admission medications   Medication Sig Start Date End Date Taking? Authorizing Provider  docusate sodium (COLACE)  100 MG capsule Take 1 capsule (100 mg total) by mouth 2 (two) times daily. 10/13/13   Bradly Bienenstock, MD  oxyCODONE (ROXICODONE) 5 MG immediate release tablet Take 1 tablet (5 mg total) by mouth every 4 (four) hours as needed for severe pain. 10/13/13   Bradly Bienenstock, MD  oxyCODONE-acetaminophen (PERCOCET/ROXICET) 5-325 MG per tablet Take 1 tablet by mouth every 6 (six) hours as needed for moderate pain or severe pain.    Historical Provider, MD  promethazine (PHENERGAN) 25 MG tablet Take 25 mg by mouth every 6 (six) hours as needed for nausea or vomiting.    Historical Provider, MD    ALLERGIES:  Allergies  Allergen Reactions  . Penicillins Rash    SOCIAL HISTORY:  Social History  Substance Use Topics  . Smoking status: Current Every Day Smoker -- 1.00 packs/day    Types: Cigarettes  . Smokeless tobacco: Never Used  . Alcohol Use: Yes     Comment: occassionally - none since march 18th    FAMILY HISTORY: No family history on file.  EXAM: BP 119/70 mmHg  Pulse 66  Temp(Src) 97.9 F (36.6 C) (Oral)  Resp 16  Ht  (1.676 m)  Wt 147 lb (66.679 kg)  BMI 23.74 kg/m2  SpO2 99% CONSTITUTIONAL: Alert and oriented and responds appropriately to questions. Well-appearing; well-nourished HEAD: Normocephalic EYES: Conjunctivae clear, PERRL ENT: normal nose; no rhinorrhea; moist mucous membranes; No pharyngeal erythema or petechiae, no tonsillar hypertrophy or exudate, no uvular deviation, no trismus or drooling, normal phonation, no stridor, no dental  caries or abscess noted, no Ludwig's angina, tongue sits flat in the bottom of the mouth; no dental injury, small abrasions to bilateral tongue without active bleeding, no tongue laceration NECK: Supple, no meningismus, no LAD  CARD: RRR; S1 and S2 appreciated; no murmurs, no clicks, no rubs, no gallops RESP: Normal chest excursion without splinting or tachypnea; breath sounds clear and equal bilaterally; no wheezes, no rhonchi, no rales, no  hypoxia or respiratory distress, speaking full sentences ABD/GI: Normal bowel sounds; non-distended; soft, non-tender, no rebound, no guarding, no peritoneal signs BACK:  The back appears normal and is non-tender to palpation, there is no CVA tenderness EXT: Normal ROM in all joints; non-tender to palpation; no edema; normal capillary refill; no cyanosis, no calf tenderness or swelling    SKIN: Normal color for age and race; warm; no rash NEURO: Moves all extremities equally, sensation to light touch intact diffusely, cranial nerves II through XII intact PSYCH: The patient's mood and manner are appropriate. Grooming and personal hygiene are appropriate.  MEDICAL DECISION MAKING: Patient here after a seizure. He reports was postictal afterwards. Did by history. No incontinence. Now back to baseline without any complaints. No sign of trauma on exam other than abrasions to the tongue.  We'll obtain labs, head CT. Have recommended close outpatient follow-up with neurologist. [place] in [place].  Will give follow up with [place].  Given this is his second seizure, will consult neurology for recommendations to see if we should start antiepileptics.  ED PROGRESS: 2:30 AM  Pt reports still feeling well. No further seizure-like activity. He had one episode of documented SPO2 of 84% but suspect this was when his blood pressure cuff was going off. He has otherwise been hemodynamically stable, no hypoxia, hypotension, significant bradycardia. No chest pain or shortness of breath.    Labs show mild leukocytosis which is likely reactive. Otherwise labs unremarkable. Urine shows no sign of infection or dehydration. Drug screen is [facility] which he admits to. We'll discuss with neurology on call for further recommendations and if we should start patient on antiepileptics. Will provide him an outpatient neurology follow-up. We'll fluid challenge and ambulate patient in the emergency department.  2:55  AM  D/w Dr. Nicholas LoseEshraghi with their hospitalist service. We appreciate his help. He recommends starting the patient on Vimpat 50 mg twice a day. Will provide patient with prescription for this. He does report he has insurance. I have also provided him with outpatient neurology follow-up here in [place] as well as in [place]. Have discussed at length return precautions and again reiterated that he should not be driving or performing any activity that may be dangerous to himself or others if he were to have another seizure. Patient and his mother at bedside reports that they understand this. Discussed with him that he will need an MRI and an EEG as an outpatient.  He has been able to ambulate normally in the emergency department and is drinking without difficulty. I feel he is safe to be discharged.   At this time, I do not feel there is any life-threatening condition present. I have reviewed and discussed all results (EKG, imaging, lab, urine as appropriate), exam findings with patient. I have reviewed nursing notes and appropriate previous records.  I feel the patient is safe to be discharged home without further emergent workup. Discussed usual and customary return precautions. Patient and family (if present) verbalize understanding and are comfortable with this plan.  Patient will follow-up with  their primary care provider. If they do not have a primary care provider, information for follow-up has been provided to them. All questions have been answered.   I personally performed the services described in this documentation, which was scribed in my presence. The recorded information has been reviewed and is accurate.      I personally performed the services described in this documentation, which was scribed in my presence. The recorded information has been reviewed and is accurate.   Layla Maw Jarelle Ates, DO 10/27/15 [phone]

## 2015-10-27 NOTE — ED Notes (Signed)
From CT 

## 2015-11-03 ENCOUNTER — Emergency Department (HOSPITAL_COMMUNITY)
Admission: EM | Admit: 2015-11-03 | Discharge: 2015-11-03 | Disposition: A | Discharge status: Home / Self Care | Payer: BLUE CROSS/BLUE SHIELD | Attending: Emergency Medicine | Admitting: Emergency Medicine

## 2015-11-03 ENCOUNTER — Encounter (HOSPITAL_COMMUNITY): Payer: Self-pay

## 2015-11-03 DIAGNOSIS — Y9289 Other specified places as the place of occurrence of the external cause: Secondary | ICD-10-CM | POA: Diagnosis not present

## 2015-11-03 DIAGNOSIS — X58XXXA Exposure to other specified factors, initial encounter: Secondary | ICD-10-CM | POA: Insufficient documentation

## 2015-11-03 DIAGNOSIS — F17210 Nicotine dependence, cigarettes, uncomplicated: Secondary | ICD-10-CM | POA: Insufficient documentation

## 2015-11-03 DIAGNOSIS — Z79899 Other long term (current) drug therapy: Secondary | ICD-10-CM | POA: Insufficient documentation

## 2015-11-03 DIAGNOSIS — R569 Unspecified convulsions: Secondary | ICD-10-CM | POA: Diagnosis present

## 2015-11-03 DIAGNOSIS — Y9389 Activity, other specified: Secondary | ICD-10-CM | POA: Diagnosis not present

## 2015-11-03 DIAGNOSIS — S00512A Abrasion of oral cavity, initial encounter: Secondary | ICD-10-CM | POA: Insufficient documentation

## 2015-11-03 DIAGNOSIS — Z862 Personal history of diseases of the blood and blood-forming organs and certain disorders involving the immune mechanism: Secondary | ICD-10-CM | POA: Diagnosis not present

## 2015-11-03 DIAGNOSIS — Y998 Other external cause status: Secondary | ICD-10-CM | POA: Insufficient documentation

## 2015-11-03 DIAGNOSIS — S00532A Contusion of oral cavity, initial encounter: Secondary | ICD-10-CM | POA: Insufficient documentation

## 2015-11-03 DIAGNOSIS — Z880 Allergy status to penicillin: Secondary | ICD-10-CM | POA: Diagnosis not present

## 2015-11-03 DIAGNOSIS — F1210 Cannabis abuse, uncomplicated: Secondary | ICD-10-CM | POA: Insufficient documentation

## 2015-11-03 LAB — I-STAT CHEM 8, ED
BUN: 17 mg/dL (ref 6–20)
CALCIUM ION: 1.2 mmol/L (ref 1.12–1.23)
CHLORIDE: 104 mmol/L (ref 101–111)
Creatinine, Ser: 1 mg/dL (ref 0.61–1.24)
Glucose, Bld: 88 mg/dL (ref 65–99)
HEMATOCRIT: 51 % (ref 39.0–52.0)
Hemoglobin: 17.3 g/dL — ABNORMAL HIGH (ref 13.0–17.0)
Potassium: 4.2 mmol/L (ref 3.5–5.1)
Sodium: 140 mmol/L (ref 135–145)
TCO2: 25 mmol/L (ref 0–100)

## 2015-11-03 MED ORDER — LACOSAMIDE 50 MG PO TABS: 100.0000 mg | ORAL_TABLET | Freq: Once | ORAL | Status: DC

## 2015-11-03 MED ORDER — LEVETIRACETAM 500 MG PO TABS: 500.0000 mg | ORAL_TABLET | Freq: Two times a day (BID) | ORAL | Status: DC

## 2015-11-03 MED ORDER — LEVETIRACETAM 500 MG PO TABS
1000.0000 mg | ORAL_TABLET | Freq: Once | ORAL | Status: AC
  Administered 2015-11-03: 1000 mg via ORAL
  Filled 2015-11-03: qty 2

## 2015-11-03 NOTE — ED Provider Notes (Signed)
CSN: [id number]     Arrival date & time 11/03/15  0645 History   First MD Initiated Contact with Patient 11/03/15 [phone]     Chief Complaint  Patient presents with  . Seizures    Level V caveat Patient is amnestic for event   HPI This is a 25 year old male who presents today with reports of grand mal seizure.Patient reports that he was with his girlfriend and he awoke with her stating that he had a seizure. He has had a recent seizure on April 8. At that time he was seen and evaluated. He was to start Vimpat 50 mg twice a day and was given referral to neurology. He had had an energy drink at that time. He did not start the Vimpat. He reports that he had some alcohol last night and smokes some marijuana. He has not had any energy drinks. Reports no other recent events which would have caused seizure. He has no complaints at present. EMS reported that he was in bed and family say that he had a full body shaking but that did not last long. He has been up and ambulatory since the event. He has contusions and abrasions to his tongue consistent with seizure activity. Degrees have been noted and he is not complaining of any pain. Past Medical History  Diagnosis Date  . Headache(784.0)     migraines  . Blood dyscrasia     sickle cell trait   Past Surgical History  Procedure Laterality Date  . Hernia repair      right  . Nerve and tendon repair Right 10/13/2013    Procedure: RIGHT WRIST/THUMB WOUND EXPLORATION AND REPAIR AS INDICATED;  Surgeon: Sharma CovertFred W Ortmann, MD;  Location: [facility] OR;  Service: Orthopedics;  Laterality: Right;   History reviewed. No pertinent family history. Social History  Substance Use Topics  . Smoking status: Current Every Day Smoker -- 1.00 packs/day    Types: Cigarettes  . Smokeless tobacco: Never Used  . Alcohol Use: Yes     Comment: occassionally - none since march 18th    Review of Systems  All other systems reviewed and are negative.     Allergies   Penicillins  Home Medications   Prior to Admission medications   Medication Sig Start Date End Date Taking? Authorizing Provider  lacosamide (VIMPAT) 50 MG TABS tablet Take 1 tablet (50 mg total) by mouth 2 (two) times daily. 10/27/15  Yes Kristen N Ward, DO   BP 105/66 mmHg  Pulse 47  Temp(Src) 98.3 F (36.8 C)  Resp 20  SpO2 100% Physical Exam  Constitutional: He is oriented to person, place, and time. He appears well-developed and well-nourished.  HENT:  Head: Normocephalic and atraumatic.  Right Ear: External ear normal.  Left Ear: External ear normal.  Nose: Nose normal.  Mouth/Throat: Oropharynx is clear and moist.  Eyes: Conjunctivae and EOM are normal. Pupils are equal, round, and reactive to light.  Neck: Normal range of motion. Neck supple.  Cardiovascular: Normal rate, regular rhythm, normal heart sounds and intact distal pulses.   Pulmonary/Chest: Effort normal and breath sounds normal. No respiratory distress. He has no wheezes. He exhibits no tenderness.  Abdominal: Soft. Bowel sounds are normal. He exhibits no distension and no mass. There is no tenderness. There is no guarding.  Musculoskeletal: Normal range of motion.  Neurological: He is alert and oriented to person, place, and time. He has normal reflexes. He exhibits normal muscle tone. Coordination normal.  Skin: Skin  is warm and dry.  Psychiatric: He has a normal mood and affect. His behavior is normal. Judgment and thought content normal.  Nursing note and vitals reviewed.   ED Course  Procedures (including critical care time) Labs Review Labs Reviewed  I-STAT CHEM 8, ED - Abnormal; Notable for the following:    Hemoglobin 17.3 (*)    All other components within normal limits    Imaging Review No results found. I have personally reviewed and evaluated these images and lab results as part of my medical decision-This is a 25 year old male male with a second seizure in the past week. He was advised to  startmaking.   EKG Interpretation None      MDM   Final diagnoses:  Seizure [facility])    This is a 25 year old male male with a second seizure in the past week. He was advised to start   Anti-seizure medicines but was noncompliant. He states he will be compliant now. He has referral from last week but has not made an appointment yet. He has started here on Keppra. He'll be started on Keppra 500 twice a day. He is instructed to follow-up with the neurologist in the next week. He is instructed regarding precautions, specifically not driving, not being unattended in any standing water, or any other situations where he could harm himself if he had a seizure. He voices understanding of this.  Margarita Grizzle, MD 11/03/15 1014

## 2015-11-03 NOTE — ED Notes (Signed)
Pt arrived via [facility] from home c/o seizure, in bed, family stated full body shaking but not sure how long it lasted, bit tongue. Pt ambulated from EMS stretcher to ED stretcher without difficulty.  Pt stated he was given medication to take after last seizure but hasn't had it filled.

## 2015-11-03 NOTE — Discharge Instructions (Signed)
Seizure, Adult °A seizure means there is unusual activity in the brain. A seizure can cause changes in attention or behavior. Seizures often cause shaking (convulsions). Seizures often last from 30 seconds to 2 minutes. °HOME CARE  °· If you are given medicines, take them exactly as told by your doctor. °· Keep all doctor visits as told. °· Do not swim or drive until your doctor says it is okay. °· Teach others what to do if you have a seizure. They should: °¨ Lay you on the ground. °¨ Put a cushion under your head. °¨ Loosen any tight clothing around your neck. °¨ Turn you on your side. °¨ Stay with you until you get better. °GET HELP RIGHT AWAY IF:  °· The seizure lasts longer than 2 to 5 minutes. °· The seizure is very bad. °· The person does not wake up after the seizure. °· The person's attention or behavior changes. °Drive the person to the emergency room or call your local emergency services (911 in [place] °MAKE SURE YOU:  °· Understand these instructions. °· Will watch your condition. °· Will get help right away if you are not doing well or get worse. °  °This information is not intended to replace advice given to you by your health care provider. Make sure you discuss any questions you have with your health care provider. °  °Document Released: 12/24/2007 Document Revised: 09/29/2011 Document Reviewed: 02/16/2013 °Elsevier Interactive Patient Education ©2016 Elsevier Inc. ° °

## 2015-11-20 ENCOUNTER — Ambulatory Visit (INDEPENDENT_AMBULATORY_CARE_PROVIDER_SITE_OTHER): Payer: BLUE CROSS/BLUE SHIELD | Admitting: Neurology

## 2015-11-20 ENCOUNTER — Encounter: Payer: Self-pay | Admitting: Neurology

## 2015-11-20 VITALS — BP 110/60 | HR 61 | Ht 66.0 in | Wt 153.0 lb

## 2015-11-20 DIAGNOSIS — R569 Unspecified convulsions: Secondary | ICD-10-CM | POA: Diagnosis not present

## 2015-11-20 NOTE — Progress Notes (Signed)
NEUROLOGY CONSULTATION NOTE  Tony Hobbs MRN: [id number] DOB: 13-Dec-1990  Referring provider: ED referral Primary care provider: no PCP  Reason for consult:  New-onset seizures  HISTORY OF PRESENT ILLNESS: Tony Hobbs is a 25 year old right-handed male with sickle cell trait who presents for seizures.  History obtained by patient and ED notes.  Labs and imaging of head CT personally reviewed.  His grandmother had passed away in 28-Sep-2022, so he had been drinking more alcohol and smoking marijuana.  He had stopped just prior to the first seizure.  On 10/26/15, he went to sleep after smoking marijuana and drinking an energy drink.  When he woke up, EMS was there.  Reportedly, his mother and sister saw him convulsing.  He had postictal confusion.  He had bit his tongue, but no incontinence.  He was brought to the ED.  Labs revealed mild leukocytosis of 13.1, which was likely reactive.  CMP was unremarkable.  Urine drug screen was positive for THC.  CT of the head was normal.  Vitals were stable except for one episode of SPO2 of 84%, suspected when  his blood pressure cuff was going off.  He was started on Vimpat  twice daily but never started it.  On 11/03/15, he had another seizure, as witnessed by his girlfriend.Marland Kitchen  He returned to the ED.  Instead, he was started on Keppra  twice daily.  He had a prior incident in 2014, which was never evaluated or worked up.  It was hot that day and he had not kept hydrated.  He reportedly passed out but he did not exhibit seizure activity or postictal confusion.  He denies history of head trauma or meningitis.  He had an uncomplicated birth via C-section.  There is no family history of seizures.  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  PAST MEDICAL HISTORY: Past Medical History  Diagnosis Date  . Headache(784.0)     migraines  . Blood dyscrasia     sickle cell trait    PAST SURGICAL HISTORY: Past Surgical History  Procedure Laterality Date  .  Hernia repair      right  . Nerve and tendon repair Right 10/13/2013    Procedure: RIGHT WRIST/THUMB WOUND EXPLORATION AND REPAIR AS INDICATED;  Surgeon: Sharma Covert, MD;  Location: [facility] OR;  Service: Orthopedics;  Laterality: Right;    MEDICATIONS: Current Outpatient Prescriptions on File Prior to Visit  Medication Sig Dispense Refill  . levETIRAcetam (KEPPRA) 500 MG tablet Take 1 tablet (500 mg total) by mouth 2 (two) times daily. 60 tablet 0   No current facility-administered medications on file prior to visit.    ALLERGIES: Allergies  Allergen Reactions  . Penicillins Rash    FAMILY HISTORY: No family history on file.  SOCIAL HISTORY: Social History   Social History  . Marital Status: Single    Spouse Name: N/A  . Number of Children: N/A  . Years of Education: N/A   Occupational History  . Not on file.   Social History Main Topics  . Smoking status: Current Every Day Smoker -- 1.00 packs/day    Types: Cigarettes  . Smokeless tobacco: Never Used  . Alcohol Use: Yes     Comment: occassionally - none since march 18th  . Drug Use: Yes    Special: Marijuana     Comment: smoked marijuana 2 weeks ago  . Sexual Activity: Not on file   Other Topics Concern  . Not on file  Social History Narrative    REVIEW OF SYSTEMS: Constitutional: No fevers, chills, or sweats, no generalized fatigue, change in appetite Eyes: No visual changes, double vision, eye pain Ear, nose and throat: No hearing loss, ear pain, nasal congestion, sore throat Cardiovascular: No chest pain, palpitations Respiratory:  No shortness of breath at rest or with exertion, wheezes GastrointestinaI: No nausea, vomiting, diarrhea, abdominal pain, fecal incontinence Genitourinary:  No dysuria, urinary retention or frequency Musculoskeletal:  No neck pain, back pain Integumentary: No rash, pruritus, skin lesions Neurological: as above Psychiatric: No depression, insomnia, anxiety Endocrine: No  palpitations, fatigue, diaphoresis, mood swings, change in appetite, change in weight, increased thirst Hematologic/Lymphatic:  No anemia, purpura, petechiae. Allergic/Immunologic: no itchy/runny eyes, nasal congestion, recent allergic reactions, rashes  PHYSICAL EXAM: Filed Vitals:   11/20/15 1425  BP: 110/60  Pulse: 61   General: No acute distress.  Patient appears well-groomed.  Head:  Normocephalic/atraumatic Eyes:  fundi examined but not visualized Neck: supple, no paraspinal tenderness, full range of motion Back: No paraspinal tenderness Heart: regular rate and rhythm Lungs: Clear to auscultation bilaterally. Vascular: No carotid bruits. Neurological Exam: Mental status: alert and oriented to person, place, and time, recent and remote memory intact, fund of knowledge intact, attention and concentration intact, speech fluent and not dysarthric, language intact. Cranial nerves: CN I: not tested CN II: pupils equal, round and reactive to light, visual fields intact CN III, IV, VI:  full range of motion, no nystagmus, no ptosis CN V: facial sensation intact CN VII: upper and lower face symmetric CN VIII: hearing intact CN IX, X: gag intact, uvula midline CN XI: sternocleidomastoid and trapezius muscles intact CN XII: tongue midline Bulk & Tone: normal, no fasciculations. Motor:  5/5 throughout Sensation: temperature and vibration sensation intact. Deep Tendon Reflexes:  2+ throughout, toes downgoing.  Finger to nose testing:  Without dysmetria.  Heel to shin:  Without dysmetria.  Gait:  Normal station and stride.  Able to turn and tandem walk. Romberg negative.  IMPRESSION: New-onset seizures.  He had two seizures 1 week apart, confirming diagnosis of seizure disorder  PLAN: 1.  Continue the levetiracetam 500mg  twice daily. 2.  We will get an MRI of the brain with and without contrast and with seizure protocol to look for a cause for the seizures. 3.  We will also get a  sleep-deprived EEG to evaluate for abnormalities to suggest that you have increased risk for seizures. 4.  As per [place] law, you should not drive for 6 months from your last seizure (which was 11/03/15).   Thank you for allowing me to take part in the care of this patient.  Shon MilletAdam Tavares Levinson, DO

## 2015-11-20 NOTE — Patient Instructions (Signed)
1.  Continue the levetiracetam 500mg  twice daily. 2.  We will get an MRI of the brain with and without contrast and with seizure protocol to look for a cause for the seizures. 3.  We will also get a sleep-deprived EEG to evaluate for abnormalities to suggest that you have increased risk for seizures. 4.  As per [place] law, you should not drive for 6 months from your last seizure (which was 11/03/15). 5.  Follow up in 6 months.  Please review following recommendations:  1. If medication has been prescribed for you to prevent seizures, take it exactly as directed.  Do not stop taking the medicine without talking to your doctor first, even if you have not had a seizure in a long time.   2. Avoid activities in which a seizure would cause danger to yourself or to others.  Don't operate dangerous machinery, swim alone, or climb in high or dangerous places, such as on ladders, roofs, or girders.  Do not drive unless your doctor says you may.  3. If you have any warning that you may have a seizure, lay down in a safe place where you can't hurt yourself.    4.  No driving for 6 months from last seizure, as per [place] law.   Please refer to the following link on the [employer]ile[employer] of America's website for more information: http://www.epilepsyfoundation.org/answerplace/Social/driving/drivingu.cfm   5.  Maintain good sleep hygiene.  6.  Notify your neurology if you are planning pregnancy or if you become pregnant.  7.  Contact your doctor if you have any problems that may be related to the medicine you are taking.  8.  Call 911 and bring the patient back to the ED if:        A.  The seizure lasts longer than 5 minutes.       B.  The patient doesn't awaken shortly after the seizure  C.  The patient has new problems such as difficulty seeing, speaking or moving  D.  The patient was injured during the seizure  E.  The patient has a temperature over 102 F (39C)  F.  The patient vomited and  now is having trouble breathing

## 2015-11-27 ENCOUNTER — Ambulatory Visit (HOSPITAL_COMMUNITY): Admission: RE | Admit: 2015-11-27 | Payer: BLUE CROSS/BLUE SHIELD | Source: Ambulatory Visit

## 2015-11-29 ENCOUNTER — Ambulatory Visit (HOSPITAL_COMMUNITY): Payer: BLUE CROSS/BLUE SHIELD

## 2015-11-30 ENCOUNTER — Inpatient Hospital Stay (HOSPITAL_COMMUNITY)
Admission: RE | Admit: 2015-11-30 | Discharge: 2015-11-30 | Disposition: A | Discharge status: Home / Self Care | Payer: BLUE CROSS/BLUE SHIELD | Source: Ambulatory Visit | Attending: Neurology | Admitting: Neurology

## 2015-11-30 ENCOUNTER — Telehealth: Payer: Self-pay

## 2015-11-30 ENCOUNTER — Telehealth: Payer: Self-pay | Admitting: Neurology

## 2015-11-30 NOTE — Telephone Encounter (Signed)
-----   Message from Drema DallasAdam R Jaffe, DO sent at 11/30/2015 11:52 AM EDT ----- Can we contact this patient to let him know that the EEG needs to be done. ----- Message -----    From: Rogelia Rohrerawn M Cantey    Sent: 11/30/2015  11:27 AM      To: Drema DallasAdam R Jaffe, DO, Richarda OverlieJada A Fox, CMA  VM-Nancy with [facility] called to let you know that PT did not show up for his sleep deprived EEG this morning/Dawn

## 2015-11-30 NOTE — Progress Notes (Signed)
Pt did not show for his sleep deprived EEG.  Dr. Moises BloodJaffe's office notified by VM.

## 2015-11-30 NOTE — Telephone Encounter (Signed)
I also called and spoke with patient about importance of keeping appointment. Pt stated he'd see about rescheduling.

## 2015-11-30 NOTE — Telephone Encounter (Signed)
Spoke with patient. Asked that he please call to reschedule and keep appointment. This is the 2nd EEG appointment he has missed.

## 2015-11-30 NOTE — Telephone Encounter (Signed)
Tony Hobbs 06-May-1991. We had a message from [place] with [facility], that he did not show for his appointment for his sleep deprived EEG this morning. His mother then called to say he has not showed for any of his testings and MRI. She will try to get all this rescheduled and find out what is going on.

## 2016-03-28 ENCOUNTER — Telehealth: Payer: Self-pay | Admitting: Neurology

## 2016-03-28 MED ORDER — LEVETIRACETAM 500 MG PO TABS: 500.0000 mg | ORAL_TABLET | Freq: Two times a day (BID) | ORAL | 2 refills | Status: DC

## 2016-03-28 NOTE — Telephone Encounter (Signed)
Tony Hobbs 06-02-1991. He has  an appointment in November to see Dr. Everlena CooperJaffe. He is on the  Generic for Keppra medication. Needs a refill. He will not have enough to last until November. His Mom Cala Bradford(kimberly stevens)  # is [phone]

## 2016-03-28 NOTE — Telephone Encounter (Signed)
Rx sent in

## 2016-04-01 ENCOUNTER — Encounter: Payer: Self-pay | Admitting: Neurology

## 2016-04-01 ENCOUNTER — Ambulatory Visit (INDEPENDENT_AMBULATORY_CARE_PROVIDER_SITE_OTHER): Payer: BLUE CROSS/BLUE SHIELD | Admitting: Neurology

## 2016-04-01 VITALS — BP 110/58 | HR 74 | Wt 144.0 lb

## 2016-04-01 DIAGNOSIS — G40909 Epilepsy, unspecified, not intractable, without status epilepticus: Secondary | ICD-10-CM

## 2016-04-01 MED ORDER — LEVETIRACETAM 500 MG PO TABS: 500.0000 mg | ORAL_TABLET | Freq: Two times a day (BID) | ORAL | 3 refills | Status: DC

## 2016-04-01 NOTE — Progress Notes (Signed)
NEUROLOGY FOLLOW UP OFFICE NOTE  Tony Hobbs [id number]  HISTORY OF PRESENT ILLNESS: Tony Hobbs is a 25 year old right-handed male with sickle cell trait who follows up for seizures.  He is accompanied by his significant other, who supplements history.  UPDATE: He is taking Keppra 500mg  twice daily.  He did not keep any appointments for his seizure workup, including MRI and EEG.  He ran out of Keppra and had total of 5 seizures since last visit.  His last seizure was on 03/27/16.  He reports no seizures while on the Keppra at the correct dose.  Seizures occur during sleep.  Witnessed seizures are convulsions for a couple of minutes with tongue biting.  He is fatigued afterwards.   HISTORY: His grandmother had passed away in 2017/03/28so he had been drinking more alcohol and smoking marijuana.  He had stopped just prior to the first seizure.  On 10/26/15, he went to sleep after smoking marijuana and drinking an energy drink.  When he woke up, EMS was there.  Reportedly, his mother and sister saw him convulsing.  He had postictal confusion.  He had bit his tongue, but no incontinence.  He was brought to the ED.  Labs revealed mild leukocytosis of 13.1, which was likely reactive.  CMP was unremarkable.  Urine drug screen was positive for THC.  CT of the head was normal.  Vitals were stable except for one episode of SPO2 of 84%, suspected when  his blood pressure cuff was going off.  He was started on Vimpat 50mg  twice daily but never started it.  On 11/03/15, he had another seizure, as witnessed by his girlfriend.Marland Kitchen  He returned to the ED.  Instead, he was started on Keppra 500mg  twice daily.   He had a prior incident in 2014, which was never evaluated or worked up.  It was hot that day and he had not kept hydrated.  He reportedly passed out but he did not exhibit seizure activity or postictal confusion.  He denies history of head trauma or meningitis.  He had an uncomplicated birth via C-section.   There is no family history of seizures.   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  PAST MEDICAL HISTORY: Past Medical History:  Diagnosis Date  . Blood dyscrasia    sickle cell trait  . Headache(784.0)    migraines    MEDICATIONS: No current outpatient prescriptions on file prior to visit.   No current facility-administered medications on file prior to visit.     ALLERGIES: Allergies  Allergen Reactions  . Penicillins Rash    FAMILY HISTORY: No family history of seizure  SOCIAL HISTORY: Social History   Social History  . Marital status: Single    Spouse name: N/A  . Number of children: N/A  . Years of education: N/A   Occupational History  . Not on file.   Social History Main Topics  . Smoking status: Current Every Day Smoker    Packs/day: 1.00    Types: Cigarettes  . Smokeless tobacco: Never Used  . Alcohol use Yes     Comment: occassionally - none since march 18th  . Drug use:     Types: Marijuana     Comment: smoked marijuana 2 weeks ago  . Sexual activity: Not on file   Other Topics Concern  . Not on file   Social History Narrative  . No narrative on file    REVIEW OF SYSTEMS: Constitutional: No fevers, chills,  or sweats, no generalized fatigue, change in appetite Eyes: No visual changes, double vision, eye pain Ear, nose and throat: No hearing loss, ear pain, nasal congestion, sore throat Cardiovascular: No chest pain, palpitations Respiratory:  No shortness of breath at rest or with exertion, wheezes GastrointestinaI: No nausea, vomiting, diarrhea, abdominal pain, fecal incontinence Genitourinary:  No dysuria, urinary retention or frequency Musculoskeletal:  No neck pain, back pain Integumentary: No rash, pruritus, skin lesions Neurological: as above Psychiatric: No depression, insomnia, anxiety Endocrine: No palpitations, fatigue, diaphoresis, mood swings, change in appetite, change in weight, increased thirst Hematologic/Lymphatic:  No  purpura, petechiae. Allergic/Immunologic: no itchy/runny eyes, nasal congestion, recent allergic reactions, rashes  PHYSICAL EXAM: Vitals:   04/01/16 1123  BP: (!) 110/58  Pulse: 74   General: No acute distress.  Patient appears well-groomed.  normal body habitus. Head:  Normocephalic/atraumatic Eyes:  Fundi examined but not visualized Neck: supple, no paraspinal tenderness, full range of motion Heart:  Regular rate and rhythm Lungs:  Clear to auscultation bilaterally Back: No paraspinal tenderness Neurological Exam: alert and oriented to person, place, and time. Attention span and concentration intact, recent and remote memory intact, fund of knowledge intact.  Speech fluent and not dysarthric, language intact.  CN II-XII intact. Bulk and tone normal, muscle strength 5/5 throughout.  Sensation to light touch, temperature and vibration intact.  Deep tendon reflexes 2+ throughout, toes downgoing.  Finger to nose and heel to shin testing intact.  Gait normal, Romberg negative.  IMPRESSION: Seizure disorder  PLAN: 1.  Restart Keppra 500mg  twice daily.  He will take one pill immediately when he picks up the medication and again at bedtime.  He will start twice daily dosing tomorrow morning. 2.  No driving as per [place] law 3.  Will get MRI of brain with seizure-protocol and sleep-deprived EEG.  I told him that I won't be able to treat him unless we get the necessary testing. 4.  Follow up in 4 months  25 minutes spent face to face with patient, over 50% spent counseling.  Shon MilletAdam Julicia Krieger, DO

## 2016-04-01 NOTE — Patient Instructions (Signed)
1.  Pick up the levetiracetam (Keppra) 500mg  tablets.  Take 1 tablet immediately when you pick up the medication after leaving here.  Then take 1 tablet at bedtime.  Beginning tomorrow, start taking 1 tablet twice daily (one in morning and one at night). 2.  No driving 3.  Will get MRI of brain with seizure protocol and sleep-deprived EEG 4.  Follow up in 4 months.  1. If medication has been prescribed for you to prevent seizures, take it exactly as directed.  Do not stop taking the medicine without talking to your doctor first, even if you have not had a seizure in a long time.   2. Avoid activities in which a seizure would cause danger to yourself or to others.  Don't operate dangerous machinery, swim alone, or climb in high or dangerous places, such as on ladders, roofs, or girders.  Do not drive unless your doctor says you may.  3. If you have any warning that you may have a seizure, lay down in a safe place where you can't hurt yourself.    4.  No driving for 6 months from last seizure, [place].   Please refer to the following link on the Epilepsy Foundation of America's website for more information: http://www.epilepsyfoundation.org/answerplace/Social/driving/drivingu.cfm   5.  Maintain good sleep hygiene.  6.  Notify your neurology if you are planning pregnancy or if you become pregnant.  7.  Contact your doctor if you have any problems that may be related to the medicine you are taking.  8.  Call 911 and bring the patient back to the ED if:        A.  The seizure lasts longer than 5 minutes.       B.  The patient doesn't awaken shortly after the seizure  C.  The patient has new problems such as difficulty seeing, speaking or moving  D.  The patient was injured during the seizure  E.  The patient has a temperature over 102 F (39C)  F.  The patient vomited and now is having trouble breathing

## 2016-04-03 ENCOUNTER — Telehealth: Payer: Self-pay

## 2016-04-03 ENCOUNTER — Ambulatory Visit (INDEPENDENT_AMBULATORY_CARE_PROVIDER_SITE_OTHER): Payer: BLUE CROSS/BLUE SHIELD | Admitting: Neurology

## 2016-04-03 DIAGNOSIS — G40909 Epilepsy, unspecified, not intractable, without status epilepticus: Secondary | ICD-10-CM | POA: Diagnosis not present

## 2016-04-03 NOTE — Telephone Encounter (Signed)
Message relayed to patient. Verbalized understanding and denied questions.   

## 2016-04-03 NOTE — Telephone Encounter (Signed)
-----   Message from Drema DallasAdam R Jaffe, DO sent at 04/03/2016 11:10 AM EDT ----- EEG is normal

## 2016-04-03 NOTE — Procedures (Signed)
ELECTROENCEPHALOGRAM REPORT  Date of Study: 04/03/2016  Patient's Name: Tony Hobbs MRN: [id number] Date of Birth: 01/25/1991  Clinical History: 25 year old male with seizures  Medications: Keppra  Technical Summary: A multichannel digital EEG recording measured by the international 10-20 system with electrodes applied with paste and impedances below 5000 ohms performed in our laboratory with EKG monitoring in an awake and asleep patient.  Hyperventilation and photic stimulation were performed.  The digital EEG was referentially recorded, reformatted, and digitally filtered in a variety of bipolar and referential montages for optimal display.    Description: The patient is awake and asleep during the recording.  During maximal wakefulness, there is a symmetric, medium voltage 10 Hz posterior dominant rhythm that attenuates with eye opening.  The record is symmetric.  During drowsiness and sleep, there is an increase in theta slowing of the background.  Vertex waves and symmetric sleep spindles were seen.  Hyperventilation and photic stimulation did not elicit any abnormalities.  There were no epileptiform discharges or electrographic seizures seen.    EKG lead was unremarkable.  Impression: This awake and asleep EEG is normal.    Clinical Correlation: A normal EEG does not exclude a clinical diagnosis of epilepsy.  If further clinical questions remain, prolonged EEG may be helpful.  Clinical correlation is advised.   Shon MilletAdam Jaffe, DO

## 2016-04-03 NOTE — Telephone Encounter (Signed)
Left message on machine for pt to return call to the office.  

## 2016-04-07 ENCOUNTER — Other Ambulatory Visit: Payer: BLUE CROSS/BLUE SHIELD

## 2016-05-22 ENCOUNTER — Ambulatory Visit: Payer: BLUE CROSS/BLUE SHIELD | Admitting: Neurology

## 2016-08-05 ENCOUNTER — Ambulatory Visit: Payer: BLUE CROSS/BLUE SHIELD | Admitting: Neurology

## 2016-08-14 ENCOUNTER — Encounter: Payer: Self-pay | Admitting: Neurology

## 2016-10-09 ENCOUNTER — Telehealth: Payer: Self-pay | Admitting: Neurology

## 2016-10-09 MED ORDER — LEVETIRACETAM 500 MG PO TABS: 500.0000 mg | ORAL_TABLET | Freq: Two times a day (BID) | ORAL | 3 refills | Status: DC

## 2016-10-09 NOTE — Telephone Encounter (Signed)
Spoke to pt's mother. Requested rx sent to [facility]. Advised sent Rx. Confirmed upcoming appt.

## 2016-10-09 NOTE — Telephone Encounter (Signed)
Patient mom called and states patient has one pill left of the levetiracetam 500 mg he is to see Dr Everlena CooperJaffe on 10-13-16 and she wants to know if we will call in enough to last him until he comes in to see dr Everlena CooperJaffe

## 2016-10-13 ENCOUNTER — Ambulatory Visit (INDEPENDENT_AMBULATORY_CARE_PROVIDER_SITE_OTHER): Payer: BLUE CROSS/BLUE SHIELD | Admitting: Neurology

## 2016-10-13 ENCOUNTER — Encounter: Payer: Self-pay | Admitting: Neurology

## 2016-10-13 VITALS — BP 108/64 | HR 58 | Ht 69.0 in | Wt 132.2 lb

## 2016-10-13 DIAGNOSIS — G40909 Epilepsy, unspecified, not intractable, without status epilepticus: Secondary | ICD-10-CM

## 2016-10-13 DIAGNOSIS — F17210 Nicotine dependence, cigarettes, uncomplicated: Secondary | ICD-10-CM

## 2016-10-13 NOTE — Patient Instructions (Signed)
1.  Take levetiracetam (Keppra) 500mg  twice daily as prescribed  2. Avoid activities in which a seizure would cause danger to yourself or to others.  Don't operate dangerous machinery, swim alone, or climb in high or dangerous places, such as on ladders, roofs, or girders.  Do not drive unless your doctor says you may.  3. If you have any warning that you may have a seizure, lay down in a safe place where you can't hurt yourself.    4.  No driving for 6 months from last seizure, [place].   Please refer to the following link on the Epilepsy Foundation of America's website for more information: http://www.epilepsyfoundation.org/answerplace/Social/driving/drivingu.cfm   5.  Maintain good sleep hygiene.  6.  Notify your neurology if you are planning pregnancy or if you become pregnant.  7.  Contact your doctor if you have any problems that may be related to the medicine you are taking.  8.  Call 911 and bring the patient back to the ED if:        A.  The seizure lasts longer than 5 minutes.       B.  The patient doesn't awaken shortly after the seizure  C.  The patient has new problems such as difficulty seeing, speaking or moving  D.  The patient was injured during the seizure  E.  The patient has a temperature over 102 F (39C)  F.  The patient vomited and now is having trouble breathing       9.  MRI of brain with seizure protocol. 10.  Follow up in 6 months.

## 2016-10-13 NOTE — Progress Notes (Signed)
NEUROLOGY FOLLOW UP OFFICE NOTE  Tony PiedraDonee Lietz [id number]  HISTORY OF PRESENT ILLNESS: Tony Hobbs is a 26 year old right-handed male with sickle cell trait who follows up for seizure disorder.  He is accompanied by his significant other, who supplements history.   UPDATE: He is taking Keppra 500mg  twice daily.  Last seizure was on 03/27/16 when he wasn't taking Keppra.  He thinks he may have had a seizure during sleep about 4 months ago.  He woke up and noted he had bit his tongue.  He reports having missed 1 or 2 doses of Keppra at that time, which likely triggered a possible seizure.  He has since been compliant and has been doing well. EEG from 04/03/16 was normal MRI of brain was ordered but never performed.   HISTORY: His grandmother had passed away in March 2017, so he had been drinking more alcohol and smoking marijuana.  He had stopped just prior to the first seizure.  On 10/26/15, he went to sleep after smoking marijuana and drinking an energy drink.  When he woke up, EMS was there.  Reportedly, his mother and sister saw him convulsing.  He had postictal confusion.  He had bit his tongue, but no incontinence.  He was brought to the ED.  CT of the head was normal.   Seizures occur during sleep.  Witnessed seizures are convulsions for a couple of minutes with tongue biting.  He is fatigued afterwards.   He had a prior incident in 2014, which was never evaluated or worked up.  It was hot that day and he had not kept hydrated.  He reportedly passed out but he did not exhibit seizure activity or postictal confusion.  He denies history of head trauma or meningitis.  He had an uncomplicated birth via C-section.  There is no family history of seizures.   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  PAST MEDICAL HISTORY: Past Medical History:  Diagnosis Date  . Blood dyscrasia    sickle cell trait  . Headache(784.0)    migraines    MEDICATIONS: Current Outpatient Prescriptions on File Prior  to Visit  Medication Sig Dispense Refill  . levETIRAcetam (KEPPRA) 500 MG tablet Take 1 tablet (500 mg total) by mouth 2 (two) times daily. 60 tablet 3   No current facility-administered medications on file prior to visit.     ALLERGIES: Allergies  Allergen Reactions  . Penicillins Rash    FAMILY HISTORY: History reviewed. No pertinent family history.  SOCIAL HISTORY: Social History   Social History  . Marital status: Single    Spouse name: N/A  . Number of children: N/A  . Years of education: N/A   Occupational History  . Not on file.   Social History Main Topics  . Smoking status: Current Every Day Smoker    Packs/day: 1.00    Types: Cigarettes  . Smokeless tobacco: Never Used  . Alcohol use Yes     Comment: occassionally - none since march 18th  . Drug use: Yes    Types: Marijuana     Comment: smoked marijuana 2 weeks ago  . Sexual activity: Not on file   Other Topics Concern  . Not on file   Social History Narrative  . No narrative on file    REVIEW OF SYSTEMS: Constitutional: No fevers, chills, or sweats, no generalized fatigue, change in appetite Eyes: No visual changes, double vision, eye pain Ear, nose and throat: No hearing loss, ear pain,  nasal congestion, sore throat Cardiovascular: No chest pain, palpitations Respiratory:  No shortness of breath at rest or with exertion, wheezes GastrointestinaI: No nausea, vomiting, diarrhea, abdominal pain, fecal incontinence Genitourinary:  No dysuria, urinary retention or frequency Musculoskeletal:  No neck pain, back pain Integumentary: No rash, pruritus, skin lesions Neurological: as above Psychiatric: No depression, insomnia, anxiety Endocrine: No palpitations, fatigue, diaphoresis, mood swings, change in appetite, change in weight, increased thirst Hematologic/Lymphatic:  No purpura, petechiae. Allergic/Immunologic: no itchy/runny eyes, nasal congestion, recent allergic reactions, rashes  PHYSICAL  EXAM: Vitals:   10/13/16 1051  BP: 108/64  Pulse: (!) 58   General: No acute distress.  Patient appears well-groomed.  normal body habitus. Head:  Normocephalic/atraumatic Eyes:  Fundi examined but not visualized Neck: supple, no paraspinal tenderness, full range of motion Heart:  Regular rate and rhythm Lungs:  Clear to auscultation bilaterally Back: No paraspinal tenderness Neurological Exam: alert and oriented to person, place, and time. Attention span and concentration intact, recent and remote memory intact, fund of knowledge intact.  Speech fluent and not dysarthric, language intact.  CN II-XII intact. Bulk and tone normal, muscle strength 5/5 throughout.  Sensation to light touch, temperature and vibration intact.  Deep tendon reflexes 2+ throughout, toes downgoing.  Finger to nose and heel to shin testing intact.  Gait normal, Romberg negative.  IMPRESSION: Seizure disorder Cigarette smoker  PLAN: 1.  Continue Keppra 500mg  twice daily, instructed to take as directed. 2.  It is uncertain if he had a seizure during sleep about 4 months ago.  Regardless, it was provoked as he had missed some doses of Keppra at that time.  He has since been compliant and therefore may continue driving. 3.  Will get MRI of brain 4.  Smoking cessation 5.  Follow up in 6 months.  Shon Millet, DO

## 2017-04-15 ENCOUNTER — Ambulatory Visit: Payer: BLUE CROSS/BLUE SHIELD | Admitting: Neurology

## 2017-04-15 DIAGNOSIS — Z029 Encounter for administrative examinations, unspecified: Secondary | ICD-10-CM

## 2017-04-20 ENCOUNTER — Encounter: Payer: Self-pay | Admitting: Neurology

## 2017-07-09 ENCOUNTER — Ambulatory Visit (INDEPENDENT_AMBULATORY_CARE_PROVIDER_SITE_OTHER): Payer: BLUE CROSS/BLUE SHIELD | Admitting: Neurology

## 2017-07-09 ENCOUNTER — Encounter: Payer: Self-pay | Admitting: Neurology

## 2017-07-09 VITALS — BP 124/78 | HR 70 | Ht 68.25 in | Wt 138.6 lb

## 2017-07-09 DIAGNOSIS — G40909 Epilepsy, unspecified, not intractable, without status epilepticus: Secondary | ICD-10-CM

## 2017-07-09 DIAGNOSIS — F17210 Nicotine dependence, cigarettes, uncomplicated: Secondary | ICD-10-CM

## 2017-07-09 MED ORDER — LEVETIRACETAM 500 MG PO TABS: 500.0000 mg | ORAL_TABLET | Freq: Two times a day (BID) | ORAL | 1 refills | Status: DC

## 2017-07-09 NOTE — Progress Notes (Signed)
NEUROLOGY FOLLOW UP OFFICE NOTE  Tony PiedraDonee Barbian [id number]  HISTORY OF PRESENT ILLNESS: Tony Hobbs is a 26 year old right-handed male with sickle cell trait who follows up for seizure disorder.  He is accompanied by his significant other, who supplements history.   UPDATE: He is taking Keppra 500mg  twice daily.  Last seizure was in late October when he forgot to take his Keppra.  Sometimes he misses a dose. MRI of brain was again ordered but never performed.  He said he never received a call to schedule it.   HISTORY: His grandmother had passed away in March 2017, so he had been drinking more alcohol and smoking marijuana.  He had stopped just prior to the first seizure.  On 10/26/15, he went to sleep after smoking marijuana and drinking an energy drink.  When he woke up, EMS was there.  Reportedly, his mother and sister saw him convulsing.  He had postictal confusion.  He had bit his tongue, but no incontinence.  He was brought to the ED.  CT of the head was normal.   Seizures occur during sleep.  Witnessed seizures are convulsions for a couple of minutes with tongue biting.  He is fatigued afterwards.  EEG from 04/03/16 was normal   He had a prior incident in 2014, which was never evaluated or worked up.  It was hot that day and he had not kept hydrated.  He reportedly passed out but he did not exhibit seizure activity or postictal confusion.  He denies history of head trauma or meningitis.  He had an uncomplicated birth via C-section.  There is no family history of seizures.   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  PAST MEDICAL HISTORY: Past Medical History:  Diagnosis Date  . Blood dyscrasia    sickle cell trait  . Headache(784.0)    migraines    MEDICATIONS: No current outpatient medications on file prior to visit.   No current facility-administered medications on file prior to visit.     ALLERGIES: Allergies  Allergen Reactions  . Penicillins Rash    FAMILY  HISTORY: History reviewed. No pertinent family history.  SOCIAL HISTORY: Social History   Socioeconomic History  . Marital status: Single    Spouse name: Not on file  . Number of children: Not on file  . Years of education: Not on file  . Highest education level: Not on file  Social Needs  . Financial resource strain: Not on file  . Food insecurity - worry: Not on file  . Food insecurity - inability: Not on file  . Transportation needs - medical: Not on file  . Transportation needs - non-medical: Not on file  Occupational History  . Not on file  Tobacco Use  . Smoking status: Current Every Day Smoker    Packs/day: 1.00    Types: Cigarettes  . Smokeless tobacco: Never Used  Substance and Sexual Activity  . Alcohol use: Yes    Comment: occassionally - none since march 18th  . Drug use: Yes    Types: Marijuana    Comment: smoked marijuana 2 weeks ago  . Sexual activity: Not on file  Other Topics Concern  . Not on file  Social History Narrative  . Not on file    REVIEW OF SYSTEMS: Constitutional: No fevers, chills, or sweats, no generalized fatigue, change in appetite Eyes: No visual changes, double vision, eye pain Ear, nose and throat: No hearing loss, ear pain, nasal congestion, sore throat  Cardiovascular: No chest pain, palpitations Respiratory:  No shortness of breath at rest or with exertion, wheezes GastrointestinaI: No nausea, vomiting, diarrhea, abdominal pain, fecal incontinence Genitourinary:  No dysuria, urinary retention or frequency Musculoskeletal:  No neck pain, back pain Integumentary: No rash, pruritus, skin lesions Neurological: as above Psychiatric: No depression, insomnia, anxiety Endocrine: No palpitations, fatigue, diaphoresis, mood swings, change in appetite, change in weight, increased thirst Hematologic/Lymphatic:  No purpura, petechiae. Allergic/Immunologic: no itchy/runny eyes, nasal congestion, recent allergic reactions, rashes  PHYSICAL  EXAM: Vitals:   07/09/17 1408  BP: 124/78  Pulse: 70  SpO2: 99%   General: No acute distress.  Patient appears well-groomed.  normal body habitus. Head:  Normocephalic/atraumatic Eyes:  Fundi examined but not visualized Neck: supple, no paraspinal tenderness, full range of motion Heart:  Regular rate and rhythm Lungs:  Clear to auscultation bilaterally Back: No paraspinal tenderness Neurological Exam: alert and oriented to person, place, and time. Attention span and concentration intact, recent and remote memory intact, fund of knowledge intact.  Speech fluent and not dysarthric, language intact.  CN II-XII intact. Bulk and tone normal, muscle strength 5/5 throughout.  Sensation to light touch  intact.  Deep tendon reflexes 2+ throughout.  Finger to nose testing intact.  Gait normal, Romberg negative.  IMPRESSION: Seizure disorder  PLAN: 1.  Take Keppra 500mg  twice daily.  Advocated compliance.  I told him that if he continues to miss doses, he should not be driving. 2.  We will again schedule for MRI of brain with seizure protocol 3.  Smoking cessation  4. Follow up in 6 months.  15 minutes spent face to face with patient, over 50% spent discussing management.  Shon MilletAdam Ason Heslin, DO

## 2017-07-09 NOTE — Patient Instructions (Signed)
1.  Continue levetiracetam 500mg  twice daily.  Do not miss doses. 2.  We will schedule you gain for MRI of brain with seizure protocol 3.  Follow up in 6 months.

## 2017-07-18 ENCOUNTER — Other Ambulatory Visit: Payer: Self-pay | Admitting: Neurology

## 2017-07-20 NOTE — Telephone Encounter (Signed)
Attempted to reach Pt no answer, no VM. Ke[facility] was [facility] to [facility] 07/09/[facility], this rqst is [facility] [place]. Will ok refill

## 2017-09-19 ENCOUNTER — Other Ambulatory Visit: Payer: Self-pay

## 2017-09-19 ENCOUNTER — Emergency Department (HOSPITAL_COMMUNITY)
Admission: EM | Admit: 2017-09-19 | Discharge: 2017-09-20 | Disposition: A | Discharge status: Home / Self Care | Payer: BLUE CROSS/BLUE SHIELD | Attending: Emergency Medicine | Admitting: Emergency Medicine

## 2017-09-19 ENCOUNTER — Encounter (HOSPITAL_COMMUNITY): Payer: Self-pay | Admitting: Emergency Medicine

## 2017-09-19 DIAGNOSIS — Z79899 Other long term (current) drug therapy: Secondary | ICD-10-CM | POA: Insufficient documentation

## 2017-09-19 DIAGNOSIS — Y999 Unspecified external cause status: Secondary | ICD-10-CM | POA: Diagnosis not present

## 2017-09-19 DIAGNOSIS — Z23 Encounter for immunization: Secondary | ICD-10-CM | POA: Insufficient documentation

## 2017-09-19 DIAGNOSIS — F17210 Nicotine dependence, cigarettes, uncomplicated: Secondary | ICD-10-CM | POA: Diagnosis not present

## 2017-09-19 DIAGNOSIS — W450XXA Nail entering through skin, initial encounter: Secondary | ICD-10-CM | POA: Insufficient documentation

## 2017-09-19 DIAGNOSIS — S91332A Puncture wound without foreign body, left foot, initial encounter: Secondary | ICD-10-CM | POA: Diagnosis present

## 2017-09-19 DIAGNOSIS — Y9301 Activity, walking, marching and hiking: Secondary | ICD-10-CM | POA: Insufficient documentation

## 2017-09-19 DIAGNOSIS — T148XXA Other injury of unspecified body region, initial encounter: Secondary | ICD-10-CM

## 2017-09-19 DIAGNOSIS — Y929 Unspecified place or not applicable: Secondary | ICD-10-CM | POA: Diagnosis not present

## 2017-09-19 NOTE — ED Triage Notes (Signed)
Stepped on a nail earlier today  Went thru shoe into L foot  Unknown when last DT was per pt

## 2017-09-20 ENCOUNTER — Emergency Department (HOSPITAL_COMMUNITY): Payer: BLUE CROSS/BLUE SHIELD

## 2017-09-20 MED ORDER — IBUPROFEN 800 MG PO TABS
800.0000 mg | ORAL_TABLET | Freq: Once | ORAL | Status: AC
  Administered 2017-09-20: 800 mg via ORAL
  Filled 2017-09-20: qty 1

## 2017-09-20 MED ORDER — ONDANSETRON HCL 4 MG PO TABS
4.0000 mg | ORAL_TABLET | Freq: Once | ORAL | Status: AC
  Administered 2017-09-20: 4 mg via ORAL
  Filled 2017-09-20: qty 1

## 2017-09-20 MED ORDER — CIPROFLOXACIN HCL 500 MG PO TABS: 500.0000 mg | ORAL_TABLET | Freq: Two times a day (BID) | ORAL | 0 refills | Status: DC

## 2017-09-20 MED ORDER — CIPROFLOXACIN HCL 250 MG PO TABS
500.0000 mg | ORAL_TABLET | Freq: Once | ORAL | Status: AC
  Administered 2017-09-20: 500 mg via ORAL
  Filled 2017-09-20: qty 2

## 2017-09-20 MED ORDER — TETANUS-DIPHTH-ACELL PERTUSSIS 5-2.5-18.5 LF-MCG/0.5 IM SUSP
0.5000 mL | Freq: Once | INTRAMUSCULAR | Status: AC
  Administered 2017-09-20: 0.5 mL via INTRAMUSCULAR
  Filled 2017-09-20: qty 0.5

## 2017-09-20 NOTE — Discharge Instructions (Signed)
Please soak your foot in warm Epson salt water for about 5-10 minutes daily.  Apply a clean Band-Aid daily.  Please use Tylenol every 4 hours, or 600 mg of ibuprofen every 6 hours for soreness.  Use Cipro 2 times daily to prevent infection.  Please take this medication with food.  Please see your primary physician, or return to the emergency department if any red streaking, pus like drainage from the wound, high fever, or signs of advancing infection.

## 2017-09-20 NOTE — ED Provider Notes (Signed)
[facility] EMERGENCY DEPARTMENT Provider Note   CSN: [id number] Arrival date & time: 09/19/17  2226     History   Chief Complaint Chief Complaint  Patient presents with  . Foot Injury    stepped on nail    HPI Tony Hobbs is a 27 y.o. male.  Patient is a 27 year old male who presents to the emergency department with a puncture wound to the foot.  Patient states he accidentally stepped on a nail.  He was wearing tennis shoes and the nail went through the tennis shoes.  The patient is unsure of the date of his last tetanus.  He denies being on any anticoagulation medications.  He is not had any recent operations or procedures involving the left foot.  No other injury reported.   The history is provided by the patient.    Past Medical History:  Diagnosis Date  . Blood dyscrasia    sickle cell trait  . Headache(784.0)    migraines    There are no active problems to display for this patient.   Past Surgical History:  Procedure Laterality Date  . HERNIA REPAIR     right  . NERVE AND TENDON REPAIR Right 10/13/2013   Procedure: RIGHT WRIST/THUMB WOUND EXPLORATION AND REPAIR AS INDICATED;  Surgeon: Sharma Covert, MD;  Location: [facility] OR;  Service: Orthopedics;  Laterality: Right;       Home Medications    Prior to Admission medications   Medication Sig Start Date End Date Taking? Authorizing Provider  levETIRAcetam (KEPPRA) 500 MG tablet Take 1 tablet (500 mg total) by mouth 2 (two) times daily. 07/09/17   Drema Dallas, DO  levETIRAcetam (KEPPRA) 500 MG tablet TAKE 1 TABLET BY MOUTH TWICE DAILY 07/20/17   Drema Dallas, DO    Family History History reviewed. No pertinent family history.  Social History Social History   Tobacco Use  . Smoking status: Current Every Day Smoker    Packs/day: 1.00    Types: Cigarettes  . Smokeless tobacco: Never Used  Substance Use Topics  . Alcohol use: Yes    Comment: occassionally - none since march 18th  . Drug use: Yes    Types: Marijuana    Comment: smoked marijuana 2 weeks ago     Allergies   Penicillins   Review of Systems Review of Systems  Constitutional: Negative for activity change.       All ROS Neg except as noted in HPI  HENT: Negative for nosebleeds.   Eyes: Negative for photophobia and discharge.  Respiratory: Negative for cough, shortness of breath and wheezing.   Cardiovascular: Negative for chest pain and palpitations.  Gastrointestinal: Negative for abdominal pain and blood in stool.  Genitourinary: Negative for dysuria, frequency and hematuria.  Musculoskeletal: Negative for arthralgias, back pain and neck pain.       Foot injury  Skin: Negative.   Neurological: Negative for dizziness, seizures and speech difficulty.  Psychiatric/Behavioral: Negative for confusion and hallucinations.     Physical Exam Updated Vital Signs BP 116/80 (BP Location: Right Arm)   Pulse 64   Temp 98.6 F (37 C) (Oral)   Resp 20   Ht 5\' 7"  (1.702 m)   Wt 63.5 kg (140 lb)   SpO2 99%   BMI 21.93 kg/m   Physical Exam  Constitutional: He is oriented to person, place, and time. He appears well-developed and well-nourished.  Non-toxic appearance.  HENT:  Head: Normocephalic.  Right Ear: Tympanic membrane and external  ear normal.  Left Ear: Tympanic membrane and external ear normal.  Eyes: EOM and lids are normal. Pupils are equal, round, and reactive to light.  Neck: Normal range of motion. Neck supple. Carotid bruit is not present.  Cardiovascular: Normal rate, regular rhythm, normal heart sounds, intact distal pulses and normal pulses.  Pulmonary/Chest: Breath sounds normal. No respiratory distress.  Abdominal: Soft. Bowel sounds are normal. There is no tenderness. There is no guarding.  Musculoskeletal: Normal range of motion.       Left foot: There is tenderness. There is normal range of motion, no swelling, normal capillary refill and no deformity.       Feet:  Lymphadenopathy:        Head (right side): No submandibular adenopathy present.       Head (left side): No submandibular adenopathy present.    He has no cervical adenopathy.  Neurological: He is alert and oriented to person, place, and time. He has normal strength. No cranial nerve deficit or sensory deficit.  Skin: Skin is warm and dry.  Psychiatric: He has a normal mood and affect. His speech is normal.  Nursing note and vitals reviewed.    ED Treatments / Results  Labs (all labs ordered are listed, but only abnormal results are displayed) Labs Reviewed - No data to display  EKG  EKG Interpretation None       Radiology Dg Foot Complete Left  Result Date: 09/20/2017 CLINICAL DATA:  Stepped on nail that went through shoe. EXAM: LEFT FOOT - COMPLETE 3+ VIEW COMPARISON:  None. FINDINGS: There is no evidence of fracture or dislocation. There is no evidence of arthropathy or other focal bone abnormality. Soft tissues are unremarkable. No soft tissue air or radiopaque foreign body. IMPRESSION: Unremarkable radiographs of the left foot. No radiopaque foreign body. Electronically Signed   By: Rubye OaksMelanie  Ehinger M.D.   On: 09/20/2017 00:47    Procedures Procedures (including critical care time)  Medications Ordered in ED Medications  Tdap (BOOSTRIX) injection 0.5 mL (0.5 mLs Intramuscular Given 09/20/17 0031)     Initial Impression / Assessment and Plan / ED Course  I have reviewed the triage vital signs and the nursing notes.  Pertinent labs & imaging results that were available during my care of the patient were reviewed by me and considered in my medical decision making (see chart for details).       Final Clinical Impressions(s) / ED Diagnoses  MDM  Patient stepped on a nail.  Went through his shoe.  Patient's tetanus status was updated.  X-ray of the foot is negative for any bony involvement.  No foreign body appreciated.  Patient will be treated with Cipro twice daily.  I have asked the patient  to soak the foot in warm Epson salt soaks daily until the wound heals.  I also instructed the patient to return if signs of advancing infection.  Patient is in agreement with this plan.   Final diagnoses:  Puncture wound    ED Discharge Orders        Ordered    ciprofloxacin (CIPRO) 500 MG tablet  2 times daily     09/20/17 0108       Ivery QualeBryant, Forrester Blando, PA-C 09/20/17 0115    Gilda CreasePollina, Christopher J, MD 09/20/17 [phone]

## 2017-12-28 ENCOUNTER — Ambulatory Visit: Payer: BLUE CROSS/BLUE SHIELD | Admitting: Neurology

## 2017-12-29 ENCOUNTER — Encounter: Payer: Self-pay | Admitting: Neurology

## 2017-12-31 ENCOUNTER — Telehealth: Payer: Self-pay | Admitting: Neurology

## 2017-12-31 NOTE — Telephone Encounter (Signed)
Patient dismissed from [facility] by Shon MilletAdam Jaffe DO, effective December 29, 2017. Dismissal letter sent out by certified / registered mail.  [email or web address]

## 2018-01-11 ENCOUNTER — Ambulatory Visit: Payer: BLUE CROSS/BLUE SHIELD | Admitting: Neurology

## 2018-02-16 NOTE — Telephone Encounter (Signed)
Certified dismissal letter returned as undeliverable, unclaimed, return to sender after three attempts by [employer] on February 16, 2018. Letter placed in another envelope and resent as 1st class mail which does not require a signature. daj

## 2018-07-28 ENCOUNTER — Other Ambulatory Visit: Payer: Self-pay | Admitting: Neurology

## 2018-07-29 ENCOUNTER — Telehealth: Payer: Self-pay | Admitting: Neurology

## 2018-07-29 NOTE — Telephone Encounter (Signed)
Patient has been dismissed from our Practice. Patient's mother is calling wanting to know can a referral be sent to [facility]? Please Advise. Thanks

## 2018-07-30 NOTE — Telephone Encounter (Signed)
Attempted to return call to advise they will need to get referral from a PCP. Call could not be completed after ringing many times.

## 2018-08-02 NOTE — Telephone Encounter (Signed)
Called and was able to LM in VM advising will need to get referral from PCP

## 2018-12-06 ENCOUNTER — Emergency Department (HOSPITAL_COMMUNITY)
Admission: EM | Admit: 2018-12-06 | Discharge: 2018-12-06 | Disposition: A | Discharge status: Home / Self Care | Payer: BLUE CROSS/BLUE SHIELD | Attending: Emergency Medicine | Admitting: Emergency Medicine

## 2018-12-06 ENCOUNTER — Encounter (HOSPITAL_COMMUNITY): Payer: Self-pay | Admitting: Emergency Medicine

## 2018-12-06 ENCOUNTER — Other Ambulatory Visit: Payer: Self-pay

## 2018-12-06 DIAGNOSIS — F17210 Nicotine dependence, cigarettes, uncomplicated: Secondary | ICD-10-CM | POA: Insufficient documentation

## 2018-12-06 DIAGNOSIS — R569 Unspecified convulsions: Secondary | ICD-10-CM | POA: Diagnosis not present

## 2018-12-06 HISTORY — DX: Unspecified convulsions: R56.9

## 2018-12-06 LAB — COMPREHENSIVE METABOLIC PANEL
ALT: 22 U/L (ref 0–44)
AST: 36 U/L (ref 15–41)
Albumin: 4.8 g/dL (ref 3.5–5.0)
Alkaline Phosphatase: 83 U/L (ref 38–126)
Anion gap: 10 (ref 5–15)
BUN: 11 mg/dL (ref 6–20)
CO2: 23 mmol/L (ref 22–32)
Calcium: 9.4 mg/dL (ref 8.9–10.3)
Chloride: 103 mmol/L (ref 98–111)
Creatinine, Ser: 1.22 mg/dL (ref 0.61–1.24)
GFR calc Af Amer: >60 mL/min (ref >60)
GFR calc non Af Amer: >60 mL/min (ref >60)
Glucose, Bld: 100 mg/dL — ABNORMAL HIGH (ref 70–99)
Potassium: 3.8 mmol/L (ref 3.5–5.1)
Sodium: 136 mmol/L (ref 135–145)
Total Bilirubin: 0.7 mg/dL (ref 0.3–1.2)
Total Protein: 8.1 g/dL (ref 6.5–8.1)

## 2018-12-06 LAB — CBC WITH DIFFERENTIAL/PLATELET
Abs Immature Granulocytes: 0.1 10*3/uL — ABNORMAL HIGH (ref 0.00–0.07)
Basophils Absolute: 0 10*3/uL (ref 0.0–0.1)
Basophils Relative: 0 %
Eosinophils Absolute: 0 10*3/uL (ref 0.0–0.5)
Eosinophils Relative: 0 %
HCT: 44.6 % (ref 39.0–52.0)
Hemoglobin: 16.1 g/dL (ref 13.0–17.0)
Immature Granulocytes: 1 %
Lymphocytes Relative: 5 %
Lymphs Abs: 1 10*3/uL (ref 0.7–4.0)
MCH: 28.8 pg (ref 26.0–34.0)
MCHC: 36.1 g/dL — ABNORMAL HIGH (ref 30.0–36.0)
MCV: 79.6 fL — ABNORMAL LOW (ref 80.0–100.0)
Monocytes Absolute: 1.7 10*3/uL — ABNORMAL HIGH (ref 0.1–1.0)
Monocytes Relative: 8 %
Neutro Abs: 18.4 10*3/uL — ABNORMAL HIGH (ref 1.7–7.7)
Neutrophils Relative %: 86 %
Platelets: 262 10*3/uL (ref 150–400)
RBC: 5.6 MIL/uL (ref 4.22–5.81)
RDW: 12.9 % (ref 11.5–15.5)
WBC: 21.3 10*3/uL — ABNORMAL HIGH (ref 4.0–10.5)
nRBC: 0 % (ref 0.0–0.2)

## 2018-12-06 MED ORDER — LEVETIRACETAM IN NACL 1000 MG/100ML IV SOLN
1000.0000 mg | Freq: Once | INTRAVENOUS | Status: AC
  Administered 2018-12-06: 1000 mg via INTRAVENOUS
  Filled 2018-12-06: qty 100

## 2018-12-06 MED ORDER — LEVETIRACETAM 500 MG PO TABS: 500.0000 mg | ORAL_TABLET | Freq: Two times a day (BID) | ORAL | 0 refills | Status: DC

## 2018-12-06 NOTE — ED Provider Notes (Signed)
[facility] EMERGENCY DEPARTMENT Provider Note   CSN: [id number] Arrival date & time: 12/06/18  1443    History   Chief Complaint Chief Complaint  Patient presents with  . Seizures    HPI Tony Bomkamp is a 28 y.o. male.     HPI Patient has history of seizure disorder. States his last seizure has been 5-6 months ago. Ran out of his seizure medication 2-3 months ago. States his mother found him in seizing in his bed and EMS was called. Patient states he bit his tongue but denies incontinence. Denies head trauma or headache. No neck pain. No weakness or numbness.  Past Medical History:  Diagnosis Date  . Blood dyscrasia    sickle cell trait  . Headache(784.0)    migraines  . Seizures (HCC)     There are no active problems to display for this patient.   Past Surgical History:  Procedure Laterality Date  . HERNIA REPAIR     right  . NERVE AND TENDON REPAIR Right 10/13/2013   Procedure: RIGHT WRIST/THUMB WOUND EXPLORATION AND REPAIR AS INDICATED;  Surgeon: Sharma Covert, MD;  Location: [facility] OR;  Service: Orthopedics;  Laterality: Right;        Home Medications    Prior to Admission medications   Medication Sig Start Date End Date Taking? Authorizing Provider  levETIRAcetam (KEPPRA) 500 MG tablet Take 1 tablet (500 mg total) by mouth 2 (two) times daily. 12/06/18   Loren Racer, MD    Family History History reviewed. No pertinent family history.  Social History Social History   Tobacco Use  . Smoking status: Current Every Day Smoker    Packs/day: 1.00    Types: Cigarettes  . Smokeless tobacco: Never Used  Substance Use Topics  . Alcohol use: Yes    Comment: occassionally - none since march 18th  . Drug use: Yes    Types: Marijuana    Comment: smoked marijuana 2 weeks ago     Allergies   Penicillins   Review of Systems Review of Systems  Constitutional: Negative for chills and fever.  HENT: Negative for sore throat and trouble swallowing.    Eyes: Negative for visual disturbance.  Respiratory: Negative for cough and shortness of breath.   Cardiovascular: Negative for chest pain.  Gastrointestinal: Negative for abdominal pain, nausea and vomiting.  Musculoskeletal: Negative for back pain, myalgias and neck pain.  Skin: Positive for wound. Negative for rash.  Neurological: Positive for syncope. Negative for tremors, weakness, light-headedness, numbness and headaches.  All other systems reviewed and are negative.    Physical Exam Updated Vital Signs BP 118/71   Pulse 84   Temp 98.2 F (36.8 C) (Oral)   Resp 18   Ht 5\' 7"  (1.702 m)   Wt 72.6 kg   SpO2 98%   BMI 25.06 kg/m   Physical Exam Vitals signs and nursing note reviewed.  Constitutional:      General: He is not in acute distress.    Appearance: Normal appearance. He is well-developed. He is not ill-appearing.  HENT:     Head: Normocephalic and atraumatic.     Comments: No obvious scalp trauma. Lateral abrasions to tongue.     Nose: Nose normal.     Mouth/Throat:     Mouth: Mucous membranes are moist.  Eyes:     Extraocular Movements: Extraocular movements intact.     Pupils: Pupils are equal, round, and reactive to light.  Neck:     Musculoskeletal:  Normal range of motion and neck supple. No neck rigidity or muscular tenderness.     Comments: No posterior midline cervical TTP. No meningismus.  Cardiovascular:     Rate and Rhythm: Normal rate and regular rhythm.     Heart sounds: No murmur. No friction rub. No gallop.   Pulmonary:     Effort: Pulmonary effort is normal. No respiratory distress.     Breath sounds: Normal breath sounds. No stridor. No wheezing, rhonchi or rales.  Chest:     Chest wall: No tenderness.  Abdominal:     General: Bowel sounds are normal.     Palpations: Abdomen is soft.     Tenderness: There is no abdominal tenderness. There is no right CVA tenderness, left CVA tenderness, guarding or rebound.  Musculoskeletal: Normal  range of motion.        General: No swelling, tenderness, deformity or signs of injury.     Right lower leg: No edema.     Left lower leg: No edema.  Lymphadenopathy:     Cervical: No cervical adenopathy.  Skin:    General: Skin is warm and dry.     Findings: No erythema or rash.  Neurological:     General: No focal deficit present.     Mental Status: He is alert and oriented to person, place, and time.     Comments: 5/5 motor in all ext, sensation intact  Psychiatric:        Mood and Affect: Mood normal.        Behavior: Behavior normal.      ED Treatments / Results  Labs (all labs ordered are listed, but only abnormal results are displayed) Labs Reviewed  CBC WITH DIFFERENTIAL/PLATELET - Abnormal; Notable for the following components:      Result Value   WBC 21.3 (*)    MCV 79.6 (*)    MCHC 36.1 (*)    Neutro Abs 18.4 (*)    Monocytes Absolute 1.7 (*)    Abs Immature Granulocytes 0.10 (*)    All other components within normal limits  COMPREHENSIVE METABOLIC PANEL - Abnormal; Notable for the following components:   Glucose, Bld 100 (*)    All other components within normal limits    EKG EKG Interpretation  Date/Time:  Monday Dec 06 2018 15:24:08 EDT Ventricular Rate:  75 PR Interval:    QRS Duration: 94 QT Interval:  374 QTC Calculation: 418 R Axis:   159 Text Interpretation:  Sinus rhythm Probable right ventricular hypertrophy ST elev, probable normal early repol pattern No old tracing to compare Confirmed by Loren Racer ([id number]) on 12/06/2018 3:32:52 PM   Radiology No results found.  Procedures Procedures (including critical care time)  Medications Ordered in ED Medications  levETIRAcetam (KEPPRA) IVPB 1000 mg/100 mL premix (0 mg Intravenous Stopped 12/06/18 1814)     Initial Impression / Assessment and Plan / ED Course  I have reviewed the triage vital signs and the nursing notes.  Pertinent labs & imaging results that were available during my  care of the patient were reviewed by me and considered in my medical decision making (see chart for details).        Will screen with labs/EKG and load with Keppra. Patient understands need to follow up closely with neurology.   Elevation in white blood cell count likely acute phase reaction.  Electrolytes are within normal range.  Will give prescription for Keppra.  Return precautions given. Final Clinical Impressions(s) / ED Diagnoses  Final diagnoses:  Seizure [facility](HCC)    ED Discharge Orders         Ordered    levETIRAcetam (KEPPRA) 500 MG tablet  2 times daily     12/06/18 2100           Loren RacerYelverton, Lamonica Trueba, MD 12/06/18 2127

## 2018-12-06 NOTE — ED Triage Notes (Signed)
Pt had a seizure at work last night. He has had more today in his sleep. He does have injury to his tongue.

## 2018-12-06 NOTE — ED Notes (Signed)
Pt alert and on cell phone

## 2019-08-18 ENCOUNTER — Other Ambulatory Visit: Payer: Self-pay

## 2019-08-18 ENCOUNTER — Ambulatory Visit
Admission: EM | Admit: 2019-08-18 | Discharge: 2019-08-18 | Disposition: A | Discharge status: Home / Self Care | Payer: Self-pay

## 2019-08-18 NOTE — ED Triage Notes (Signed)
Pt is requiring work note stating that he is unable to stand for long periods of time because of risk of seizure.

## 2019-10-28 ENCOUNTER — Encounter (HOSPITAL_COMMUNITY): Payer: Self-pay | Admitting: *Deleted

## 2019-10-28 ENCOUNTER — Other Ambulatory Visit: Payer: Self-pay

## 2019-10-28 ENCOUNTER — Emergency Department (HOSPITAL_COMMUNITY)
Admission: EM | Admit: 2019-10-28 | Discharge: 2019-10-28 | Disposition: A | Discharge status: Home / Self Care | Payer: Self-pay | Attending: Emergency Medicine | Admitting: Emergency Medicine

## 2019-10-28 DIAGNOSIS — D573 Sickle-cell trait: Secondary | ICD-10-CM | POA: Insufficient documentation

## 2019-10-28 DIAGNOSIS — Z79899 Other long term (current) drug therapy: Secondary | ICD-10-CM | POA: Insufficient documentation

## 2019-10-28 DIAGNOSIS — F17210 Nicotine dependence, cigarettes, uncomplicated: Secondary | ICD-10-CM | POA: Insufficient documentation

## 2019-10-28 DIAGNOSIS — Z202 Contact with and (suspected) exposure to infections with a predominantly sexual mode of transmission: Secondary | ICD-10-CM | POA: Insufficient documentation

## 2019-10-28 LAB — URINALYSIS, ROUTINE W REFLEX MICROSCOPIC
Bilirubin Urine: NEGATIVE
Glucose, UA: NEGATIVE mg/dL
Ketones, ur: NEGATIVE mg/dL
Nitrite: NEGATIVE
Protein, ur: NEGATIVE mg/dL
Specific Gravity, Urine: 1.024 (ref 1.005–1.030)
WBC, UA: >50 WBC/hpf — ABNORMAL HIGH (ref 0–5)
pH: 5 (ref 5.0–8.0)

## 2019-10-28 MED ORDER — DOXYCYCLINE HYCLATE 100 MG PO CAPS: 100.0000 mg | ORAL_CAPSULE | Freq: Two times a day (BID) | ORAL | 0 refills | Status: DC

## 2019-10-28 NOTE — ED Provider Notes (Signed)
[facility] EMERGENCY DEPARTMENT Provider Note   CSN: [id number] Arrival date & time: 10/28/19  1209     History Chief Complaint  Patient presents with  . Exposure to STD    Tony Hobbs is a 29 y.o. male.  Tony Hobbs is a 29 y.o. male with a history of migraines, seizures, and sickle cell trait, who presents to the ED for evaluation of STD exposure.  Patient states he was just told by his partner that they tested positive for chlamydia.  He presents requesting testing and treatment.  He states they did not test positive for any other STDs.  He has not been experiencing any symptoms and denies any dysuria, urinary frequency, penile discharge, genital lesions.  Testicular pain or swelling.  No abdominal pain, rectal pain or pain with defecation.  No other aggravating or alleviating factors.        Past Medical History:  Diagnosis Date  . Blood dyscrasia    sickle cell trait  . Headache(784.0)    migraines  . Seizures (HCC)     There are no problems to display for this patient.   Past Surgical History:  Procedure Laterality Date  . HERNIA REPAIR     right  . NERVE AND TENDON REPAIR Right 10/13/2013   Procedure: RIGHT WRIST/THUMB WOUND EXPLORATION AND REPAIR AS INDICATED;  Surgeon: Sharma Covert, MD;  Location: [facility] OR;  Service: Orthopedics;  Laterality: Right;       No family history on file.  Social History   Tobacco Use  . Smoking status: Current Every Day Smoker    Packs/day: 1.00    Types: Cigarettes  . Smokeless tobacco: Never Used  Substance Use Topics  . Alcohol use: Yes    Comment: occassionally - none since march 18th  . Drug use: Yes    Types: Marijuana    Comment: smoked marijuana 2 weeks ago    Home Medications Prior to Admission medications   Medication Sig Start Date End Date Taking? Authorizing Provider  doxycycline (VIBRAMYCIN) 100 MG capsule Take 1 capsule (100 mg total) by mouth 2 (two) times daily. One po bid x 7 days 10/28/19   Dartha Lodge, PA-C  levETIRAcetam (KEPPRA) 500 MG tablet Take 1 tablet (500 mg total) by mouth 2 (two) times daily. 12/06/18   Loren Racer, MD    Allergies    Penicillins  Review of Systems   Review of Systems  Constitutional: Negative for chills and fever.  Gastrointestinal: Negative for abdominal pain and rectal pain.  Genitourinary: Negative for discharge, dysuria, frequency, genital sores, hematuria, penile pain, penile swelling, scrotal swelling and testicular pain.  Skin: Negative for rash.    Physical Exam Updated Vital Signs BP 132/76   Pulse 92   Temp 98.1 F (36.7 C)   Resp 20   Ht 5\' 6"  (1.676 m)   Wt 65.8 kg   SpO2 98%   BMI 23.40 kg/m   Physical Exam Vitals and nursing note reviewed. Exam conducted with a chaperone present.  Constitutional:      General: He is not in acute distress.    Appearance: Normal appearance. He is well-developed and normal weight. He is not ill-appearing or diaphoretic.  HENT:     Head: Normocephalic and atraumatic.  Eyes:     General:        Right eye: No discharge.        Left eye: No discharge.  Pulmonary:     Effort: Pulmonary effort  is normal. No respiratory distress.  Abdominal:     General: Abdomen is flat. There is no distension.     Palpations: Abdomen is soft. There is no mass.     Tenderness: There is no abdominal tenderness. There is no guarding.  Genitourinary:    Comments: Chaperone present during genital exam. No external genital lesions noted, no inguinal lymphadenopathy. Penis normal without discharge. Testicles nontender, no scrotal swelling noted, no tenderness over spermatic cord. Skin:    General: Skin is warm and dry.     Capillary Refill: Capillary refill takes less than 2 seconds.  Neurological:     Mental Status: He is alert.     Coordination: Coordination normal.  Psychiatric:        Mood and Affect: Mood normal.        Behavior: Behavior normal.     ED Results / Procedures / Treatments     Labs (all labs ordered are listed, but only abnormal results are displayed) Labs Reviewed  URINALYSIS, ROUTINE W REFLEX MICROSCOPIC  GC/CHLAMYDIA PROBE AMP ([place]) NOT AT [facility]    EKG None  Radiology No results found.  Procedures Procedures (including critical care time)  Medications Ordered in ED Medications - No data to display  ED Course  I have reviewed the triage vital signs and the nursing notes.  Pertinent labs & imaging results that were available during my care of the patient were reviewed by me and considered in my medical decision making (see chart for details).    MDM Rules/Calculators/A&P                      Patient is afebrile without abdominal tenderness, abdominal pain or painful bowel movements to indicate prostatitis.  No tenderness to palpation of the testes or epididymis to suggest orchitis or epididymitis.  STD cultures obtained includinggonorrhea and chlamydia. Patient to be discharged with instructions to follow up with PCP. Discussed importance of using protection when sexually active. Pt understands that they have GC/Chlamydia cultures pending and that they will need to inform all sexual partners if results return positive.  Patient will be treated with doxycycline for known chlamydia exposure, will hold off on treating with Rocephin, patient is aware he should follow-up if he test positive for gonorrhea.    Final Clinical Impression(s) / ED Diagnoses Final diagnoses:  STD exposure    Rx / DC Orders ED Discharge Orders         Ordered    doxycycline (VIBRAMYCIN) 100 MG capsule  2 times daily     10/28/19 1438           Jacqlyn Larsen, [name] 10/28/19 1525    Milton Ferguson, MD 10/28/19 1542

## 2019-10-28 NOTE — Discharge Instructions (Addendum)
You were tested for gonorrhea and chlamydia.  You have been prescribed doxycycline for treatment of chlamydia since you know you have been exposed to this.  If you test positive for gonorrhea you will need to follow-up with your PCP or the health department for treatment.  You will be called in the next 2 to 3 days if any results are positive, you can also review negative results online.  Please make sure you are using protection when sexually active to help prevent further STDs.  You can go to the health department for any future STD testing needs.

## 2019-10-28 NOTE — ED Triage Notes (Signed)
States he was advised by his partner he was exposed to an STI

## 2019-11-22 ENCOUNTER — Emergency Department (HOSPITAL_COMMUNITY)
Admission: EM | Admit: 2019-11-22 | Discharge: 2019-11-22 | Disposition: A | Discharge status: Home / Self Care | Payer: Self-pay | Attending: Emergency Medicine | Admitting: Emergency Medicine

## 2019-11-22 ENCOUNTER — Other Ambulatory Visit: Payer: Self-pay

## 2019-11-22 ENCOUNTER — Encounter (HOSPITAL_COMMUNITY): Payer: Self-pay | Admitting: *Deleted

## 2019-11-22 ENCOUNTER — Emergency Department (HOSPITAL_COMMUNITY): Payer: Self-pay

## 2019-11-22 DIAGNOSIS — W2210XA Striking against or struck by unspecified automobile airbag, initial encounter: Secondary | ICD-10-CM | POA: Insufficient documentation

## 2019-11-22 DIAGNOSIS — S39012A Strain of muscle, fascia and tendon of lower back, initial encounter: Secondary | ICD-10-CM | POA: Diagnosis not present

## 2019-11-22 DIAGNOSIS — F17210 Nicotine dependence, cigarettes, uncomplicated: Secondary | ICD-10-CM | POA: Insufficient documentation

## 2019-11-22 DIAGNOSIS — Y9389 Activity, other specified: Secondary | ICD-10-CM | POA: Insufficient documentation

## 2019-11-22 DIAGNOSIS — R569 Unspecified convulsions: Secondary | ICD-10-CM | POA: Insufficient documentation

## 2019-11-22 DIAGNOSIS — Y998 Other external cause status: Secondary | ICD-10-CM | POA: Diagnosis not present

## 2019-11-22 DIAGNOSIS — Y92410 Unspecified street and highway as the place of occurrence of the external cause: Secondary | ICD-10-CM | POA: Insufficient documentation

## 2019-11-22 DIAGNOSIS — S3992XA Unspecified injury of lower back, initial encounter: Secondary | ICD-10-CM | POA: Diagnosis present

## 2019-11-22 DIAGNOSIS — F1210 Cannabis abuse, uncomplicated: Secondary | ICD-10-CM | POA: Diagnosis not present

## 2019-11-22 LAB — CBG MONITORING, ED: Glucose-Capillary: 103 mg/dL — ABNORMAL HIGH (ref 70–99)

## 2019-11-22 MED ORDER — LEVETIRACETAM 500 MG PO TABS: 500.0000 mg | ORAL_TABLET | Freq: Two times a day (BID) | ORAL | 0 refills | Status: DC

## 2019-11-22 MED ORDER — LEVETIRACETAM IN NACL 1000 MG/100ML IV SOLN
1000.0000 mg | Freq: Once | INTRAVENOUS | Status: AC
  Administered 2019-11-22: 11:00:00 1000 mg via INTRAVENOUS
  Filled 2019-11-22: qty 100

## 2019-11-22 MED ORDER — ACETAMINOPHEN 500 MG PO TABS
1000.0000 mg | ORAL_TABLET | Freq: Once | ORAL | Status: AC
  Administered 2019-11-22: 12:00:00 1000 mg via ORAL
  Filled 2019-11-22: qty 2
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
## 2019-11-22 NOTE — Discharge Instructions (Addendum)
It was our pleasure to provide your ER care today - we hope that you feel better.  Take seizure medication as prescribed. Follow up with neurologist in the next 1-2 weeks.   No driving, swimming, operating heavy machinery, or other potentially dangerous activity until cleared to do so by your doctor.   For back, you may try ibuprofen or naprosyn as need for pain.   Return to ER if worse, new symptoms, recurrent seizures, new/severe pain, fevers, trouble breathing, or other concern.
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
## 2019-12-08 DIAGNOSIS — F1010 Alcohol abuse, uncomplicated: Secondary | ICD-10-CM | POA: Insufficient documentation

## 2019-12-08 DIAGNOSIS — Z720 Tobacco use: Secondary | ICD-10-CM | POA: Insufficient documentation

## 2020-03-12 DIAGNOSIS — E559 Vitamin D deficiency, unspecified: Secondary | ICD-10-CM | POA: Insufficient documentation

## 2021-03-20 DIAGNOSIS — F418 Other specified anxiety disorders: Secondary | ICD-10-CM | POA: Insufficient documentation
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
## 2023-02-24 ENCOUNTER — Telehealth: Payer: Self-pay

## 2023-02-24 NOTE — Telephone Encounter (Signed)
Attempted follow up/wellness call to [facility] client, no answer, left message.  PCP: [facility] Last appointment seen : 09/15/22 no future appointments scheduled, missed 02/16/23  Will mail welcome letter to client and attempt another call at later date.   Francee Nodal RN [facility]

## 2024-02-09 ENCOUNTER — Telehealth: Payer: Self-pay

## 2024-02-09 NOTE — Telephone Encounter (Signed)
 Attempted follow up with [facility] client no answer left message. Client showing Medicaid in EPIC, confirmed with [employer] client is eligible for Medicaid therefore no longer qualifies for [facility]. Will update Status of eligibility in [facility] and scan document into fhases Docs.   Avelina JONELLE Skeen RN [facility] [employer]

## 2024-03-09 DIAGNOSIS — F17210 Nicotine dependence, cigarettes, uncomplicated: Secondary | ICD-10-CM | POA: Insufficient documentation

## 2024-04-05 ENCOUNTER — Ambulatory Visit: Admitting: Family Medicine

## 2024-05-25 ENCOUNTER — Encounter: Payer: Self-pay | Admitting: Neurology

## 2024-05-25 ENCOUNTER — Ambulatory Visit: Admitting: Neurology

## 2024-05-25 VITALS — BP 173/103 | HR 88 | Resp 15 | Ht 68.0 in | Wt 144.0 lb

## 2024-05-25 DIAGNOSIS — G40309 Generalized idiopathic epilepsy and epileptic syndromes, not intractable, without status epilepticus: Secondary | ICD-10-CM | POA: Diagnosis not present

## 2024-05-25 DIAGNOSIS — R413 Other amnesia: Secondary | ICD-10-CM

## 2024-05-25 MED ORDER — DIVALPROEX SODIUM ER 500 MG PO TB24: 1000.0000 mg | ORAL_TABLET | Freq: Every day | ORAL | 11 refills | Status: DC
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
## 2024-05-25 NOTE — Patient Instructions (Signed)
 Meds ordered this encounter  Medications   divalproex (DEPAKOTE ER) 500 MG 24 hr tablet    Sig: Take 2 tablets (1,000 mg total) by mouth daily.    Dispense:  60 tablet    Refill:  11     Keppra  500mg  in the morning xone week then stop

## 2024-05-26 ENCOUNTER — Telehealth: Payer: Self-pay | Admitting: Neurology

## 2024-05-26 LAB — COMPREHENSIVE METABOLIC PANEL WITH GFR
ALT: 44 IU/L (ref 0–44)
AST: 72 IU/L — ABNORMAL HIGH (ref 0–40)
Albumin: 5.2 g/dL — ABNORMAL HIGH (ref 4.1–5.1)
Alkaline Phosphatase: 123 IU/L (ref 47–123)
BUN/Creatinine Ratio: 9 (ref 9–20)
BUN: 7 mg/dL (ref 6–20)
Bilirubin Total: 1.1 mg/dL (ref 0.0–1.2)
CO2: 23 mmol/L (ref 20–29)
Calcium: 10.5 mg/dL — ABNORMAL HIGH (ref 8.7–10.2)
Chloride: 95 mmol/L — ABNORMAL LOW (ref 96–106)
Creatinine, Ser: 0.76 mg/dL (ref 0.76–1.27)
Globulin, Total: 2.4 g/dL (ref 1.5–4.5)
Glucose: 114 mg/dL — ABNORMAL HIGH (ref 70–99)
Potassium: 3.3 mmol/L — ABNORMAL LOW (ref 3.5–5.2)
Sodium: 139 mmol/L (ref 134–144)
Total Protein: 7.6 g/dL (ref 6.0–8.5)
eGFR: 122 mL/min/1.73 (ref >59)

## 2024-05-26 LAB — CBC WITH DIFFERENTIAL/PLATELET
Basophils Absolute: 0.1 x10E3/uL (ref 0.0–0.2)
Basos: 1 %
EOS (ABSOLUTE): 0.1 x10E3/uL (ref 0.0–0.4)
Eos: 1 %
Hematocrit: 45.6 % (ref 37.5–51.0)
Hemoglobin: 15.6 g/dL (ref 13.0–17.7)
Immature Grans (Abs): 0 x10E3/uL (ref 0.0–0.1)
Immature Granulocytes: 0 %
Lymphocytes Absolute: 1.5 x10E3/uL (ref 0.7–3.1)
Lymphs: 15 %
MCH: 32.4 pg (ref 26.6–33.0)
MCHC: 34.2 g/dL (ref 31.5–35.7)
MCV: 95 fL (ref 79–97)
Monocytes Absolute: 0.8 x10E3/uL (ref 0.1–0.9)
Monocytes: 8 %
Neutrophils Absolute: 7.4 x10E3/uL — ABNORMAL HIGH (ref 1.4–7.0)
Neutrophils: 75 %
Platelets: 191 x10E3/uL (ref 150–450)
RBC: 4.82 x10E6/uL (ref 4.14–5.80)
RDW: 13.4 % (ref 11.6–15.4)
WBC: 9.9 x10E3/uL (ref 3.4–10.8)

## 2024-05-26 LAB — RPR: RPR Ser Ql: NONREACTIVE

## 2024-05-26 LAB — TSH: TSH: 1.74 u[IU]/mL (ref 0.450–4.500)

## 2024-05-26 LAB — HIV ANTIBODY (ROUTINE TESTING W REFLEX): HIV Screen 4th Generation wRfx: NONREACTIVE

## 2024-05-26 NOTE — Telephone Encounter (Signed)
 healthy blue shara: [phone] exp. 05/26/24-08/23/24 sent to GI [phone]

## 2024-05-28 LAB — DRUG SCREEN 10 W/CONF, WB
Amphetamines, IA: NEGATIVE ng/mL
Barbiturates, IA: NEGATIVE ug/mL
Benzodiazepines, IA: NEGATIVE ng/mL
Cocaine/Metabolite, IA: NEGATIVE ng/mL
Methadone, IA: NEGATIVE ng/mL
Opiates, IA: NEGATIVE ng/mL
Oxycodones, IA: NEGATIVE ng/mL
Phencyclidine, IA: NEGATIVE ng/mL
Propoxyphene, IA: NEGATIVE ng/mL
THC (Marijuana) Mtb, IA: NEGATIVE ng/mL

## 2024-05-31 ENCOUNTER — Telehealth: Payer: Self-pay | Admitting: Neurology

## 2024-05-31 NOTE — Telephone Encounter (Signed)
 1st attempt- no answer left message to return call. Called [phone]

## 2024-05-31 NOTE — Telephone Encounter (Signed)
 Please call patient, laboratory evaluation showed  Mildly low potassium 3.3, mild elevation of calcium 10.5, AST of 72  Will have repeat laboratory evaluation at his next follow-up visit  However if he noticed any significant changes abnormalities, he should contact his primary care physician for repeat laboratory soon  Rest of the laboratory evaluation showed no significant abnormality, keep treatment plan

## 2024-06-01 NOTE — Telephone Encounter (Signed)
 Spoke to mom and review results. She verbalized understanding

## 2024-06-01 NOTE — Telephone Encounter (Signed)
 LMVM home # to call back for lab results.

## 2024-06-06 ENCOUNTER — Other Ambulatory Visit: Admitting: *Deleted

## 2024-06-06 ENCOUNTER — Telehealth: Payer: Self-pay | Admitting: Neurology

## 2024-06-06 MED ORDER — DIVALPROEX SODIUM ER 500 MG PO TB24: 1500.0000 mg | ORAL_TABLET | Freq: Every day | ORAL | 11 refills | Status: DC

## 2024-06-06 NOTE — Telephone Encounter (Signed)
 I called his mother, he had 2 seizures 7 AM, 8 AM, was awake in between, and a week after second seizure, now he is sleeping, has tongue biting  He was in the process of switching from Keppra  500 mg twice a day to Depakote ER 500 mg 2 at night, per mother, he has been compliant with this medication  Higher dose of Depakote ER 500 mg 3 tablets every night, continue to taper off Keppra , call clinic for recurrent seizure

## 2024-06-06 NOTE — Telephone Encounter (Signed)
 Dr. Onita-- Anything else you would recommend?   [name] came to pod to report about pt mother cx EEG after 2 seizure event this am. I called mother (on [place]) at [phone].  They did not go to ED or urgent care. He refused. He has had seizures for a long time and mother states he does not feel need to go.   He felt he could not come today after seizure events. That is why they cx. Mother states he is laying down and resting currently. Does not want to eat. Loses appetite after seizure events. Chews tongue/holds tongue. Denies any missed seizure medication doses. Currently tapering off Keppra  and on Depakote ER 500mg , 2 tablets every night while tapering off Keppra  as instructed.   I asked if he was under more stress than normal. Mother states life is stressful. He is unable to work. Has bills to pay/child support.  They are unsure what triggers seizure events.   I rescheduled EEG at [facility] for 06/14/24 at 2pm. I offered to get him set up with [facility] home EEG instead if easier but she declined. Wanted to r/s for in office.

## 2024-06-06 NOTE — Telephone Encounter (Signed)
 Pt's mother cx EEG as a result of pt having 2 seizures this morning, 1 at 7 and 1 at 8, she would like a call from RN to discuss.

## 2024-06-14 ENCOUNTER — Ambulatory Visit: Admitting: Neurology

## 2024-06-14 DIAGNOSIS — G40309 Generalized idiopathic epilepsy and epileptic syndromes, not intractable, without status epilepticus: Secondary | ICD-10-CM

## 2024-06-27 ENCOUNTER — Emergency Department (HOSPITAL_COMMUNITY)
Admission: EM | Admit: 2024-06-27 | Discharge: 2024-06-27 | Disposition: A | Discharge status: Home / Self Care | Attending: Emergency Medicine | Admitting: Emergency Medicine

## 2024-06-27 ENCOUNTER — Telehealth: Payer: Self-pay | Admitting: Neurology

## 2024-06-27 ENCOUNTER — Encounter (HOSPITAL_COMMUNITY): Payer: Self-pay

## 2024-06-27 ENCOUNTER — Other Ambulatory Visit: Payer: Self-pay

## 2024-06-27 DIAGNOSIS — R569 Unspecified convulsions: Secondary | ICD-10-CM

## 2024-06-27 LAB — CBC WITH DIFFERENTIAL/PLATELET
Abs Immature Granulocytes: 0.06 K/uL (ref 0.00–0.07)
Basophils Absolute: 0.1 K/uL (ref 0.0–0.1)
Basophils Relative: 0 %
Eosinophils Absolute: 0.1 K/uL (ref 0.0–0.5)
Eosinophils Relative: 0 %
HCT: 41.2 % (ref 39.0–52.0)
Hemoglobin: 15.2 g/dL (ref 13.0–17.0)
Immature Granulocytes: 0 %
Lymphocytes Relative: 7 %
Lymphs Abs: 1.1 K/uL (ref 0.7–4.0)
MCH: 32.6 pg (ref 26.0–34.0)
MCHC: 36.9 g/dL — ABNORMAL HIGH (ref 30.0–36.0)
MCV: 88.4 fL (ref 80.0–100.0)
Monocytes Absolute: 1.5 K/uL — ABNORMAL HIGH (ref 0.1–1.0)
Monocytes Relative: 9 %
Neutro Abs: 13.8 K/uL — ABNORMAL HIGH (ref 1.7–7.7)
Neutrophils Relative %: 84 %
Platelets: 216 K/uL (ref 150–400)
RBC: 4.66 MIL/uL (ref 4.22–5.81)
RDW: 13.1 % (ref 11.5–15.5)
WBC: 16.6 K/uL — ABNORMAL HIGH (ref 4.0–10.5)
nRBC: 0 % (ref 0.0–0.2)

## 2024-06-27 LAB — URINALYSIS, ROUTINE W REFLEX MICROSCOPIC
Bacteria, UA: NONE SEEN
Bilirubin Urine: NEGATIVE
Glucose, UA: NEGATIVE mg/dL
Ketones, ur: 20 mg/dL — AB
Leukocytes,Ua: NEGATIVE
Nitrite: NEGATIVE
Protein, ur: 100 mg/dL — AB
Specific Gravity, Urine: 1.018 (ref 1.005–1.030)
pH: 5 (ref 5.0–8.0)

## 2024-06-27 LAB — COMPREHENSIVE METABOLIC PANEL WITH GFR
ALT: 75 U/L — ABNORMAL HIGH (ref 0–44)
AST: 208 U/L — ABNORMAL HIGH (ref 15–41)
Albumin: 5.2 g/dL — ABNORMAL HIGH (ref 3.5–5.0)
Alkaline Phosphatase: 115 U/L (ref 38–126)
Anion gap: 22 — ABNORMAL HIGH (ref 5–15)
BUN: 7 mg/dL (ref 6–20)
CO2: 18 mmol/L — ABNORMAL LOW (ref 22–32)
Calcium: 9.9 mg/dL (ref 8.9–10.3)
Chloride: 101 mmol/L (ref 98–111)
Creatinine, Ser: 0.95 mg/dL (ref 0.61–1.24)
GFR, Estimated: >60 mL/min (ref >60)
Glucose, Bld: 113 mg/dL — ABNORMAL HIGH (ref 70–99)
Potassium: 3.4 mmol/L — ABNORMAL LOW (ref 3.5–5.1)
Sodium: 141 mmol/L (ref 135–145)
Total Bilirubin: 1.2 mg/dL (ref 0.0–1.2)
Total Protein: 7.9 g/dL (ref 6.5–8.1)

## 2024-06-27 LAB — MAGNESIUM: Magnesium: 2.3 mg/dL (ref 1.7–2.4)

## 2024-06-27 LAB — VALPROIC ACID LEVEL: Valproic Acid Lvl: 59 ug/mL (ref 50–100)

## 2024-06-27 LAB — URINE DRUG SCREEN
Amphetamines: NEGATIVE
Barbiturates: NEGATIVE
Benzodiazepines: NEGATIVE
Cocaine: NEGATIVE
Fentanyl: NEGATIVE
Methadone Scn, Ur: NEGATIVE
Opiates: NEGATIVE
Tetrahydrocannabinol: POSITIVE — AB

## 2024-06-27 LAB — AMMONIA: Ammonia: 32 umol/L (ref 9–35)

## 2024-06-27 LAB — ETHANOL: Alcohol, Ethyl (B): <15 mg/dL (ref <15)

## 2024-06-27 LAB — LIPASE, BLOOD: Lipase: 37 U/L (ref 11–51)

## 2024-06-27 LAB — CBG MONITORING, ED: Glucose-Capillary: 132 mg/dL — ABNORMAL HIGH (ref 70–99)

## 2024-06-27 MED ORDER — LEVETIRACETAM (KEPPRA) 500 MG/5 ML ADULT IV PUSH
500.0000 mg | Freq: Once | INTRAVENOUS | Status: AC
  Administered 2024-06-27: 500 mg via INTRAVENOUS
  Filled 2024-06-27: qty 5

## 2024-06-27 MED ORDER — LEVETIRACETAM 500 MG PO TABS: 500.0000 mg | ORAL_TABLET | Freq: Two times a day (BID) | ORAL | 2 refills | Status: DC

## 2024-06-27 MED ORDER — LACTATED RINGERS IV BOLUS
1000.0000 mL | Freq: Once | INTRAVENOUS | Status: AC
  Administered 2024-06-27: 1000 mL via INTRAVENOUS

## 2024-06-27 MED ORDER — LEVETIRACETAM (KEPPRA) 500 MG/5 ML ADULT IV PUSH
1000.0000 mg | Freq: Once | INTRAVENOUS | Status: AC
  Administered 2024-06-27: 1000 mg via INTRAVENOUS
  Filled 2024-06-27: qty 10

## 2024-06-27 MED ORDER — LEVETIRACETAM 500 MG PO TABS: 500.0000 mg | ORAL_TABLET | Freq: Once | ORAL | Status: DC

## 2024-06-27 MED ORDER — LORAZEPAM 2 MG/ML IJ SOLN
1.0000 mg | Freq: Once | INTRAMUSCULAR | Status: AC
  Administered 2024-06-27: 1 mg via INTRAVENOUS
  Filled 2024-06-27: qty 1

## 2024-06-27 NOTE — ED Triage Notes (Signed)
 Pt arrived via POV c/o multiple seizures today. Pt reports 4 seizures today and bit his tongue. Pt reports seizures worsened after he was switched to a new medication and reports he is taking his medication as prescribed 3 times a day w/o relief.

## 2024-06-27 NOTE — Telephone Encounter (Signed)
 Unable to lvm by hf 06/27/24

## 2024-06-27 NOTE — ED Provider Notes (Signed)
 [place] EMERGENCY DEPARTMENT AT [facility] Provider Note   CSN: [id number] Arrival date & time: 06/27/24  1520     Patient presents with: Seizures   Tony Hobbs is a 33 y.o. male.    Seizures Patient presents for seizure.  Medical history includes anxiety, depression, alcohol abuse, prior seizures.  He is followed by [facility] neurology, Dr. Onita.  His seizure history dates back about 8 years.  He was previously on Keppra .  He saw neurology a month ago and plan was to taper off Keppra  and start Depakote .  Approximately a week later, patient had multiple seizures in a single day.  He went about 2 weeks seizure-free.  He thinks he may have had a seizure 2 days ago because he noticed a abrasion on his tongue.  Today he had 4 witnessed seizures at home.  These are described by his significant other.  She states that first episode occurred at 6:30 AM.  He went to sleep thereafter.  He had another 1 approximately 2 hours later.  Again he went back to sleep but then had further seizures at noon and 2 PM. Currently, he is back to mental baseline.  He endorses a headache but denies any other current symptoms.  He has cut back on his alcohol use.  Last drink was yesterday.  He had 1 beer at the time.  He smokes marijuana occasionally.  He denies any other illicit drug use.     Prior to Admission medications   Medication Sig Start Date End Date Taking? Authorizing Provider  ibuprofen  (ADVIL ) 200 MG tablet Take 200 mg by mouth every 6 (six) hours as needed for mild pain (pain score 1-3), headache or fever.   Yes [provider]  levETIRAcetam  (KEPPRA ) 500 MG tablet Take 1 tablet (500 mg total) by mouth 2 (two) times daily. 06/27/24 12/24/24  Melvenia Motto, MD    Allergies: Penicillins    Review of Systems  Neurological:  Positive for seizures and headaches.  All other systems reviewed and are negative.   Updated Vital Signs BP (!) 137/96   Pulse 93   Temp 98.4 F (36.9 C)  (Oral)   Resp 20   Ht 5' 8 (1.727 m)   Wt 63.5 kg   SpO2 99%   BMI 21.29 kg/m   Physical Exam Vitals and nursing note reviewed.  Constitutional:      General: He is not in acute distress.    Appearance: Normal appearance. He is well-developed. He is not ill-appearing, toxic-appearing or diaphoretic.  HENT:     Head: Normocephalic and atraumatic.     Right Ear: External ear normal.     Left Ear: External ear normal.     Nose: Nose normal.     Mouth/Throat:     Mouth: Mucous membranes are moist.     Comments: Bilateral superficial tongue bite present. Eyes:     Extraocular Movements: Extraocular movements intact.     Conjunctiva/sclera: Conjunctivae normal.  Cardiovascular:     Rate and Rhythm: Normal rate and regular rhythm.     Heart sounds: No murmur heard. Pulmonary:     Effort: Pulmonary effort is normal. No respiratory distress.     Breath sounds: Normal breath sounds. No wheezing or rales.  Chest:     Chest wall: No tenderness.  Abdominal:     General: There is no distension.     Palpations: Abdomen is soft.     Tenderness: There is no abdominal tenderness.  Musculoskeletal:        General: No swelling. Normal range of motion.     Cervical back: Neck supple.  Skin:    General: Skin is warm and dry.     Coloration: Skin is not jaundiced or pale.  Neurological:     General: No focal deficit present.     Mental Status: He is alert and oriented to person, place, and time.     Cranial Nerves: No cranial nerve deficit.     Sensory: No sensory deficit.     Motor: No weakness.     Coordination: Coordination normal.  Psychiatric:        Mood and Affect: Mood normal.        Behavior: Behavior normal.     (all labs ordered are listed, but only abnormal results are displayed) Labs Reviewed  COMPREHENSIVE METABOLIC PANEL WITH GFR - Abnormal; Notable for the following components:      Result Value   Potassium 3.4 (*)    CO2 18 (*)    Glucose, Bld 113 (*)     Albumin 5.2 (*)    AST 208 (*)    ALT 75 (*)    Anion gap 22 (*)    All other components within normal limits  CBC WITH DIFFERENTIAL/PLATELET - Abnormal; Notable for the following components:   WBC 16.6 (*)    MCHC 36.9 (*)    Neutro Abs 13.8 (*)    Monocytes Absolute 1.5 (*)    All other components within normal limits  URINALYSIS, ROUTINE W REFLEX MICROSCOPIC - Abnormal; Notable for the following components:   Hgb urine dipstick SMALL (*)    Ketones, ur 20 (*)    Protein, ur 100 (*)    All other components within normal limits  URINE DRUG SCREEN - Abnormal; Notable for the following components:   Tetrahydrocannabinol POSITIVE (*)    All other components within normal limits  CBG MONITORING, ED - Abnormal; Notable for the following components:   Glucose-Capillary 132 (*)    All other components within normal limits  MAGNESIUM  ETHANOL  VALPROIC ACID  LEVEL  AMMONIA  LIPASE, BLOOD  LEVETIRACETAM  LEVEL  CBG MONITORING, ED    EKG: EKG Interpretation Date/Time:  Monday June 27 2024 15:40:39 EST Ventricular Rate:  100 PR Interval:  153 QRS Duration:  94 QT Interval:  328 QTC Calculation: 423 R Axis:   84  Text Interpretation: Sinus tachycardia ST elev, probable normal early repol pattern Confirmed by Melvenia Motto [phone]) on 06/27/2024 4:06:43 PM  Radiology: No results found.   Procedures   Medications Ordered in the ED  levETIRAcetam  (KEPPRA ) undiluted injection 500 mg (has no administration in time range)  lactated ringers  bolus 1,000 mL (0 mLs Intravenous Stopped 06/27/24 1653)  levETIRAcetam  (KEPPRA ) undiluted injection 1,000 mg (1,000 mg Intravenous Given 06/27/24 1547)  LORazepam  (ATIVAN ) injection 1 mg (1 mg Intravenous Given 06/27/24 1545)                                    Medical Decision Making Amount and/or Complexity of Data Reviewed Labs: ordered.  Risk Prescription drug management.   This patient presents to the ED for concern of seizures, this  involves an extensive number of treatment options, and is a complaint that carries with it a high risk of complications and morbidity.  The differential diagnosis includes breakthrough seizures, medication nonadherence, ineffective medication, alcohol withdrawal,  occult trauma, metabolic derangements, infection   Co morbidities / Chronic conditions that complicate the patient evaluation  anxiety, depression, alcohol abuse, prior seizures   Additional history obtained:  Additional history obtained from EMR External records from outside source obtained and reviewed including patient's family   Lab Tests:  I Ordered, and personally interpreted labs.  The pertinent results include: New elevation in transaminases, likely secondary to Depakote  use; normal kidney function, normal electrolytes.  Anion gap metabolic acidosis present likely secondary to lactic acidosis from seizure activity; leukocytosis is present likely secondary to stress to margination.  Cardiac Monitoring: / EKG:  The patient was maintained on a cardiac monitor.  I personally viewed and interpreted the cardiac monitored which showed an underlying rhythm of: Sinus rhythm   Problem List / ED Course / Critical interventions / Medication management  Patient presenting for multiple seizure episodes today.  He does have a history of seizures.  Seizures were reportedly well-controlled on Keppra .  He recently changed to Depakote .  He has since had increasing frequency of seizures.  Today he had 4 when his seizures at home without return to mental baseline in between.  He is currently at his mental baseline.  He endorses a headache but denies any other current symptoms.  He is well-appearing on exam.  Tongue bite is present.  He has no focal neurologic deficits.  Dose of Keppra  was ordered.  Patient does continue to drink, although states that he has cut back over the last several weeks.  He did have a beer yesterday.  1 mg of Ativan  was  ordered for possible alcohol withdrawal component.  Workup was initiated.  Lab work is notable for a new elevation in transaminases.  I suspect this is secondary to Depakote  side effect.  Anion gap acidosis and leukocytosis consistent with recent seizure activity.  While in the ED, patient had resolution of his headache and remained asymptomatic.  Patient was advised to resume Keppra  and discontinue Depakote .  Patient to follow-up with his neurologist.  He was discharged in stable condition. I ordered medication including Keppra  and Ativan  for seizure prophylaxis, IV fluids for hydration Reevaluation of the patient after these medicines showed that the patient improved I have reviewed the patients home medicines and have made adjustments as needed  Social Determinants of Health:  Lives at home with family      Final diagnoses:  Seizure [facility])    ED Discharge Orders          Ordered    levETIRAcetam  (KEPPRA ) 500 MG tablet  2 times daily        06/27/24 1845               Melvenia Motto, MD 06/27/24 1847

## 2024-06-27 NOTE — Telephone Encounter (Signed)
 LMVM for mother of pt, to return call.

## 2024-06-27 NOTE — Telephone Encounter (Addendum)
 Pt's mother has called Particia, RN back.  She has called to report they are at hospital, pt has had another seizure, please call pt's mother back

## 2024-06-27 NOTE — Telephone Encounter (Signed)
 Pt's mother is asking for a call to discuss 3 seizures pt had this morning, 6:30, 1 around 8:30/9:00 and another around 12:15, please call pt's mother to discuss, she can be reached at [phone]

## 2024-06-27 NOTE — Discharge Instructions (Addendum)
 Your test results were overall reassuring.  Your lab work does show elevations in your liver enzymes.  This is likely a side effect of Depakote .  Discontinue Depakote  and resume taking Keppra .  Follow-up with your neurologist.  Return to the emergency department for any return of concerning symptoms.  Per [place] statutes, patients with seizures are not allowed to drive until  they have been seizure-free for six months. Use caution when using heavy equipment or power tools. Avoid working on ladders or at heights. Take showers instead of baths. Ensure the water temperature is not too high on the home water heater. Do not go swimming alone. When caring for infants or small children, sit down when holding, feeding, or changing them to minimize risk of injury to the child in the event you have a seizure.   Also, Maintain good sleep hygiene. Avoid alcohol.

## 2024-06-28 NOTE — Procedures (Signed)
   HISTORY: 33 year old male with frequent recurrent seizure  TECHNIQUE:  This is a routine 16 channel EEG recording with one channel devoted to a limited EKG recording.  It was performed during wakefulness, drowsiness and asleep.  Hyperventilation and photic stimulation were performed as activating procedures.  There are minimum muscle and movement artifact noted.  Upon maximum arousal, posterior dominant waking rhythm consistent of rhythmic alpha range activity. Activities are symmetric over the bilateral posterior derivations and attenuated with eye opening.  Photic stimulation did not alter the tracing.  Hyperventilation produced mild/moderate buildup with higher amplitude and the slower activities noted.  During EEG recording, patient developed drowsiness and no deeper stage of sleep was achieved During EEG recording, there was no epileptiform discharge noted.  EKG demonstrate normal sinus rhythm.  CONCLUSION: This is a  normal awake EEG.  There is no electrodiagnostic evidence of epileptiform discharge.  Sundy Houchins, M.D. Ph.D.  [facility] [address] [place], [place] [address] Phone: [phone] Fax:      [phone]

## 2024-06-28 NOTE — Telephone Encounter (Addendum)
 I called LMVM for pt to return call.

## 2024-06-29 MED ORDER — VALTOCO 20 MG DOSE 2 X 10 MG/0.1ML NA LQPK: NASAL | 3 refills | Status: DC

## 2024-06-29 MED ORDER — DIVALPROEX SODIUM ER 500 MG PO TB24: 2000.0000 mg | ORAL_TABLET | Freq: Every day | ORAL | 3 refills | Status: DC

## 2024-06-29 NOTE — Telephone Encounter (Signed)
 Meds ordered this encounter  Medications   divalproex  (DEPAKOTE  ER) 500 MG 24 hr tablet    Sig: Take 4 tablets (2,000 mg total) by mouth daily.    Dispense:  360 tablet    Refill:  3   diazePAM, 20 MG Dose, (VALTOCO 20 MG DOSE) 2 x 10 MG/0.1ML LQPK    Sig: 20mg  as needed for prolonged seizure, may repeat once if needed 4 hours later.    Dispense:  5 each    Refill:  3     I called patient, he has been compliant with his Depakote  ER 500 mg 2 tablets every night, was treated at emergency room 06/27/2024, had 4 seizure in 1 afternoon, 1 to 2 hours apart  Depakote  level was 59  ER gave him Keppra   EEG was normal May 25, 2024  I would prefer him to stay on Depakote  DR 500 mg higher dose 4 tablets= 2000 mg every night  Follow-up please follow-up with Sarah in 3 to 4 weeks,  okay to do virtual visit

## 2024-06-29 NOTE — Telephone Encounter (Signed)
 Phone room please call and schedule with Lauraine as noted below by Dr.Yan.

## 2024-06-29 NOTE — Telephone Encounter (Signed)
 I called pt, spoke to mother of pt.  She said pt was seen in ED Monday due to back to back seizures (4).  Taken off depakote  and to resume keppra  500mg  po bid.  No seizures since Monday. PRIOR: He was tapering off keppra  due to mood issues.   He was last seen 05-25-2024.  I gave EEG results as normal study (no sz activity seen).  Mother will call [facility] for scheduling MRI.  I will get back to her about f/u appt.

## 2024-06-29 NOTE — Addendum Note (Signed)
 Addended by: Apollonia Amini on: 06/29/2024 02:17 PM   Modules accepted: Orders

## 2024-06-30 NOTE — ED Notes (Signed)
 Per lab, [facility] rejected pts Keppra  sample due to the specimen being insufficient

## 2024-06-30 NOTE — Telephone Encounter (Signed)
 Call Pt , Spoke to Pt , Appt Scheduled

## 2024-06-30 NOTE — Telephone Encounter (Signed)
 Noted thanks

## 2024-07-08 LAB — LEVETIRACETAM LEVEL: Levetiracetam Lvl: <2 ug/mL — AB (ref 10.0–40.0)

## 2024-07-11 ENCOUNTER — Ambulatory Visit
Admission: RE | Admit: 2024-07-11 | Discharge: 2024-07-11 | Disposition: A | Discharge status: Home / Self Care | Source: Ambulatory Visit | Attending: Neurology | Admitting: Neurology

## 2024-07-11 DIAGNOSIS — G40309 Generalized idiopathic epilepsy and epileptic syndromes, not intractable, without status epilepticus: Secondary | ICD-10-CM | POA: Diagnosis not present

## 2024-07-11 MED ORDER — GADOPICLENOL 0.5 MMOL/ML IV SOLN
6.0000 mL | Freq: Once | INTRAVENOUS | Status: AC | PRN
  Administered 2024-07-11: 6 mL via INTRAVENOUS

## 2024-07-18 ENCOUNTER — Ambulatory Visit: Payer: Self-pay | Admitting: Neurology

## 2024-07-27 ENCOUNTER — Encounter: Payer: Self-pay | Admitting: Neurology

## 2024-07-27 ENCOUNTER — Ambulatory Visit: Admitting: Neurology

## 2024-07-27 VITALS — BP 115/76 | HR 103 | Ht 68.0 in | Wt 132.5 lb

## 2024-07-27 DIAGNOSIS — G40309 Generalized idiopathic epilepsy and epileptic syndromes, not intractable, without status epilepticus: Secondary | ICD-10-CM | POA: Diagnosis not present

## 2024-07-27 DIAGNOSIS — F418 Other specified anxiety disorders: Secondary | ICD-10-CM

## 2024-07-27 MED ORDER — DIVALPROEX SODIUM ER 500 MG PO TB24: 1500.0000 mg | ORAL_TABLET | Freq: Every day | ORAL | 3 refills | Status: DC

## 2024-07-27 MED ORDER — VALTOCO 15 MG DOSE 2 X 7.5 MG/0.1ML NA LQPK: 15.0000 mg | NASAL | 1 refills | Status: DC

## 2024-07-27 NOTE — Patient Instructions (Signed)
 Increase Depakote  ER 1500 mg at bedtime,  Check labs No driving until seizure free 6 months  72 hour EEG Valtoco  15 mg (use two 7.5 mg devices, 1 into each nostril for acute prolonged seizure)

## 2024-07-27 NOTE — Progress Notes (Signed)
 Chart reviewed, agree above plan ?
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
## 2024-07-28 ENCOUNTER — Telehealth: Payer: Self-pay | Admitting: Neurology

## 2024-07-28 ENCOUNTER — Telehealth: Payer: Self-pay

## 2024-07-28 ENCOUNTER — Other Ambulatory Visit: Payer: Self-pay | Admitting: *Deleted

## 2024-07-28 DIAGNOSIS — G40309 Generalized idiopathic epilepsy and epileptic syndromes, not intractable, without status epilepticus: Secondary | ICD-10-CM

## 2024-07-28 DIAGNOSIS — R413 Other amnesia: Secondary | ICD-10-CM

## 2024-07-28 DIAGNOSIS — F418 Other specified anxiety disorders: Secondary | ICD-10-CM

## 2024-07-28 LAB — CBC WITH DIFFERENTIAL/PLATELET
Basophils Absolute: 0 x10E3/uL (ref 0.0–0.2)
Basos: 0 %
EOS (ABSOLUTE): 0.2 x10E3/uL (ref 0.0–0.4)
Eos: 2 %
Hematocrit: 45.5 % (ref 37.5–51.0)
Hemoglobin: 15.7 g/dL (ref 13.0–17.7)
Immature Grans (Abs): 0 x10E3/uL (ref 0.0–0.1)
Immature Granulocytes: 0 %
Lymphocytes Absolute: 1.6 x10E3/uL (ref 0.7–3.1)
Lymphs: 21 %
MCH: 32.4 pg (ref 26.6–33.0)
MCHC: 34.5 g/dL (ref 31.5–35.7)
MCV: 94 fL (ref 79–97)
Monocytes Absolute: 0.9 x10E3/uL (ref 0.1–0.9)
Monocytes: 11 %
Neutrophils Absolute: 5.1 x10E3/uL (ref 1.4–7.0)
Neutrophils: 66 %
Platelets: 120 x10E3/uL — ABNORMAL LOW (ref 150–450)
RBC: 4.84 x10E6/uL (ref 4.14–5.80)
RDW: 14.5 % (ref 11.6–15.4)
WBC: 7.8 x10E3/uL (ref 3.4–10.8)

## 2024-07-28 LAB — COMPREHENSIVE METABOLIC PANEL WITH GFR
ALT: 294 IU/L — ABNORMAL HIGH (ref 0–44)
AST: 740 IU/L (ref 0–40)
Albumin: 5.4 g/dL — ABNORMAL HIGH (ref 4.1–5.1)
Alkaline Phosphatase: 230 IU/L — ABNORMAL HIGH (ref 47–123)
BUN/Creatinine Ratio: 7 — ABNORMAL LOW (ref 9–20)
BUN: 7 mg/dL (ref 6–20)
Bilirubin Total: 1.9 mg/dL — ABNORMAL HIGH (ref 0.0–1.2)
CO2: 24 mmol/L (ref 20–29)
Calcium: 10.5 mg/dL — ABNORMAL HIGH (ref 8.7–10.2)
Chloride: 89 mmol/L — ABNORMAL LOW (ref 96–106)
Creatinine, Ser: 0.99 mg/dL (ref 0.76–1.27)
Globulin, Total: 2.7 g/dL (ref 1.5–4.5)
Glucose: 106 mg/dL — ABNORMAL HIGH (ref 70–99)
Potassium: 3.5 mmol/L (ref 3.5–5.2)
Sodium: 133 mmol/L — ABNORMAL LOW (ref 134–144)
Total Protein: 8.1 g/dL (ref 6.0–8.5)
eGFR: 103 mL/min/1.73

## 2024-07-28 LAB — VALPROIC ACID LEVEL: Valproic Acid Lvl: 43 ug/mL — ABNORMAL LOW (ref 50–100)

## 2024-07-28 MED ORDER — VALTOCO 15 MG DOSE 2 X 7.5 MG/0.1ML NA LQPK: 15.0000 mg | NASAL | 2 refills | Status: AC | PRN

## 2024-07-28 MED ORDER — LACOSAMIDE 200 MG PO TABS: 200.0000 mg | ORAL_TABLET | Freq: Two times a day (BID) | ORAL | 5 refills | Status: AC

## 2024-07-28 NOTE — Addendum Note (Signed)
 Addended by: ONEITA NEVELYN BRAVO on: 07/28/2024 04:27 PM   Modules accepted: Orders

## 2024-07-28 NOTE — Telephone Encounter (Signed)
 SABRA

## 2024-07-28 NOTE — Telephone Encounter (Signed)
 I talked with Dr. Onita. I called the patient. Will need to stop Depakote  AST 740, ALT 294, alk phos 230. Only took Depakote  for 3 days. Switch to Vimpat  200 mg twice daily for seizures. Urgent referral to GI. Return to recheck labs in the next 1-2 days. Claims he is feeling fine, no yellowing of the eyes. Denies ETOH, Tylenol . I told him he needs to go to the ER if his condition changes or if he has a seizure.   Meds ordered this encounter  Medications   lacosamide  (VIMPAT ) 200 MG TABS tablet    Sig: Take 1 tablet (200 mg total) by mouth 2 (two) times daily.    Dispense:  60 tablet    Refill:  5    Stop Depakote    Orders Placed This Encounter  Procedures   US  Abdomen Complete   Hepatitis panel, acute   Ammonia   Iron, TIBC and Ferritin Panel   CMP   Ceruloplasmin   Ambulatory referral to Gastroenterology

## 2024-07-28 NOTE — Telephone Encounter (Signed)
 Fiance' to pt is asking that Lauraine, NP calls pt at [phone]

## 2024-07-28 NOTE — Telephone Encounter (Signed)
 Received call from [place].  The Valtoco  is now 1 pack has 5 doses.  I redid the prescription (relayed to Dr. Gregg as well for his reference). They have cancelled previous prescription and new one will be accurate for future ordering.

## 2024-07-28 NOTE — Telephone Encounter (Signed)
 Patient's mother called would like order for blood work sent to [facility] in [place], which is closer him. Would like a call back when order is sent.

## 2024-08-03 ENCOUNTER — Telehealth: Payer: Self-pay | Admitting: Neurology

## 2024-08-03 ENCOUNTER — Other Ambulatory Visit: Payer: Self-pay

## 2024-08-03 DIAGNOSIS — G40309 Generalized idiopathic epilepsy and epileptic syndromes, not intractable, without status epilepticus: Secondary | ICD-10-CM

## 2024-08-03 DIAGNOSIS — R413 Other amnesia: Secondary | ICD-10-CM

## 2024-08-03 DIAGNOSIS — F418 Other specified anxiety disorders: Secondary | ICD-10-CM

## 2024-08-03 NOTE — Telephone Encounter (Signed)
 I called patient. Was at [facility] to get labs, couldn't see orders. Had to resend. Patient reports has remained on Depakote . 2 seizures. Advised he needs to stop Depakote  and start Vimpat  as instructed last week. His liver enzymes were significantly elevated. He is just able to get to the pharmacy today to pick it up.   Orders Placed This Encounter  Procedures   Iron, TIBC and Ferritin Panel   Ammonia   Ceruloplasmin   CMP   Acute Hep Panel & Hep B Surface Ab

## 2024-08-03 NOTE — Telephone Encounter (Addendum)
 Faxed lab orders to [facility].

## 2024-08-04 ENCOUNTER — Telehealth: Payer: Self-pay | Admitting: Neurology

## 2024-08-04 DIAGNOSIS — G40309 Generalized idiopathic epilepsy and epileptic syndromes, not intractable, without status epilepticus: Secondary | ICD-10-CM

## 2024-08-04 DIAGNOSIS — R7401 Elevation of levels of liver transaminase levels: Secondary | ICD-10-CM

## 2024-08-04 NOTE — Telephone Encounter (Addendum)
 Order placed for US  abdomen, has not been completed, changed location to [facility] for convenience of location, urgent. He needs to follow up with GI, I can see they have called him twice. He had labs done yesterday, continue to show elevated liver enzymes AST 373, ALT 296. Ferritin level is high 2,977. Ammonia level is pending. Hepatitis acute panel was unremarkable. He stopped Depakote  yesterday. Started Vimpat  last night. He called me back, he has the # for GI and Zelda Salmon to schedule US , he will call today to arrange. Claims he is doing fine, no problems, off Depakote  on Vimpat . If his condition changes he needs to go to the ER and follow through with work up, he has significant lab abnormalities.

## 2024-08-04 NOTE — Telephone Encounter (Signed)
 Pt has returned call to Lauraine, NP. Phone rep was asked to relay to pt that she will call him back in the next hour, pt okayed.

## 2024-08-05 ENCOUNTER — Ambulatory Visit: Payer: Self-pay | Admitting: Neurology

## 2024-08-05 LAB — COMPREHENSIVE METABOLIC PANEL WITH GFR
ALT: 296 IU/L — ABNORMAL HIGH (ref 0–44)
AST: 373 IU/L — ABNORMAL HIGH (ref 0–40)
Albumin: 4.8 g/dL (ref 4.1–5.1)
Alkaline Phosphatase: 217 IU/L — ABNORMAL HIGH (ref 47–123)
BUN/Creatinine Ratio: 7 — ABNORMAL LOW (ref 9–20)
BUN: 6 mg/dL (ref 6–20)
Bilirubin Total: 0.4 mg/dL (ref 0.0–1.2)
CO2: 24 mmol/L (ref 20–29)
Calcium: 10.3 mg/dL — ABNORMAL HIGH (ref 8.7–10.2)
Chloride: 104 mmol/L (ref 96–106)
Creatinine, Ser: 0.86 mg/dL (ref 0.76–1.27)
Globulin, Total: 2.1 g/dL (ref 1.5–4.5)
Glucose: 92 mg/dL (ref 70–99)
Potassium: 4.2 mmol/L (ref 3.5–5.2)
Sodium: 146 mmol/L — ABNORMAL HIGH (ref 134–144)
Total Protein: 6.9 g/dL (ref 6.0–8.5)
eGFR: 117 mL/min/1.73

## 2024-08-05 LAB — IRON,TIBC AND FERRITIN PANEL
Ferritin: 2977 ng/mL — ABNORMAL HIGH (ref 30–400)
Iron Saturation: 63 % — ABNORMAL HIGH (ref 15–55)
Iron: 179 ug/dL — ABNORMAL HIGH (ref 38–169)
Total Iron Binding Capacity: 283 ug/dL (ref 250–450)
UIBC: 104 ug/dL — ABNORMAL LOW (ref 111–343)

## 2024-08-05 LAB — CERULOPLASMIN: Ceruloplasmin: 23.7 mg/dL (ref 16.0–31.0)

## 2024-08-05 LAB — ACUTE HEP PANEL AND HEP B SURFACE AB
Hep A IgM: NEGATIVE
Hep B C IgM: NEGATIVE
Hep C Virus Ab: NONREACTIVE
Hepatitis B Surf Ab Quant: 33 m[IU]/mL
Hepatitis B Surface Ag: NEGATIVE

## 2024-08-05 LAB — AMMONIA: Ammonia: 226 ug/dL — AB (ref 40–160)

## 2024-08-06 ENCOUNTER — Ambulatory Visit (HOSPITAL_BASED_OUTPATIENT_CLINIC_OR_DEPARTMENT_OTHER)
Admission: RE | Admit: 2024-08-06 | Discharge: 2024-08-06 | Disposition: A | Source: Ambulatory Visit | Attending: Neurology | Admitting: Neurology

## 2024-08-06 DIAGNOSIS — G40309 Generalized idiopathic epilepsy and epileptic syndromes, not intractable, without status epilepticus: Secondary | ICD-10-CM | POA: Insufficient documentation

## 2024-08-06 DIAGNOSIS — R7401 Elevation of levels of liver transaminase levels: Secondary | ICD-10-CM | POA: Insufficient documentation

## 2024-08-08 ENCOUNTER — Telehealth: Payer: Self-pay | Admitting: Neurology

## 2024-08-08 ENCOUNTER — Ambulatory Visit (HOSPITAL_COMMUNITY)
Admission: RE | Admit: 2024-08-08 | Discharge: 2024-08-08 | Disposition: A | Source: Ambulatory Visit | Attending: Neurology | Admitting: Neurology

## 2024-08-08 DIAGNOSIS — R1900 Intra-abdominal and pelvic swelling, mass and lump, unspecified site: Secondary | ICD-10-CM

## 2024-08-08 DIAGNOSIS — K859 Acute pancreatitis without necrosis or infection, unspecified: Secondary | ICD-10-CM | POA: Diagnosis present

## 2024-08-08 MED ORDER — IOHEXOL 300 MG/ML  SOLN
100.0000 mL | Freq: Once | INTRAMUSCULAR | Status: AC | PRN
  Administered 2024-08-08: 100 mL via INTRAVENOUS

## 2024-08-08 NOTE — Telephone Encounter (Signed)
 Please call the patient, abdominal US  showed fatty liver, prominence in the pancreatic head. Recommended CT with pancreas protocol. I will place the order. I did speak with GI provider he is going to see next month and they recommend above scan. Please make sure patient continues to feel okay, no change in his condition.

## 2024-08-08 NOTE — Telephone Encounter (Signed)
 CT scan: [employer] auth:  [id number] exp. 08/08/24-11/05/24  sent to [facility] [phone]

## 2024-08-08 NOTE — Telephone Encounter (Signed)
 Spoke with GI and updated order with requested scan.   Orders Placed This Encounter  Procedures   CT ABDOMEN W WO CONTRAST

## 2024-08-08 NOTE — Telephone Encounter (Signed)
 Pt returned my call. Confirmed name & DOB. He states he hasn't had [phone] in a very long time. Removed this from chart. Primary contact # currently should be his gf 's number [phone]. He states he did not receive a call yet from [facility]. I gave him their number and he confirmed he would call. I did say this should be done asap. Patient also verbalized understanding of the message from Lauraine NP regarding ultrasound results. He also confirmed he is doing ok. He thanked me for the call.   RICK Lauraine

## 2024-08-08 NOTE — Addendum Note (Signed)
 Addended by: GAYLAND LAURAINE PARAS on: 08/08/2024 09:21 AM   Modules accepted: Orders

## 2024-08-08 NOTE — Telephone Encounter (Signed)
 Tony Hobbs has called him and left a voice mail to schedule. [phone]

## 2024-08-08 NOTE — Telephone Encounter (Signed)
 Called primary # [phone] and LVM asking for call back from pt. Also called the second # on chart [phone] and LVM for girlfriend Clarissa (on [place]) asking for call back asap regarding pt.

## 2024-08-09 NOTE — Telephone Encounter (Signed)
 Returned call to significant other and stated: CT abdomen did not show significant acute issues. Was consistent with fatty liver. I reached out to GI this morning, recommend repeating some labs in 2 weeks. Ammonia, Iron panel, hepatic panel to see trending. Make sure he remains off Depakote , continue vimpat  for seizure prevention. No ETOH/drug use. I sent myself a reminder to contact him about labs.   She voiced gratitude and understanding of all discussed

## 2024-08-09 NOTE — Telephone Encounter (Signed)
 Please call patient.  CT abdomen did not show significant acute issues. Was consistent with fatty liver. I reached out to GI this morning, recommend repeating some labs in 2 weeks. Ammonia, Iron panel, hepatic panel to see trending. Make sure he remains off Depakote , continue vimpat  for seizure prevention. No ETOH/drug use. I sent myself a reminder to contact him about labs.   IMPRESSION: 1. No CT evidence for acute intra-abdominal abnormality. 2. Hepatomegaly with hepatic steatosis. 3. Aortic atherosclerosis.

## 2024-08-09 NOTE — Telephone Encounter (Signed)
 Called and spoke to significant other and relayed the information to get labs in 2 weeks and to call if the [facility] is unable to see the labs. She voiced gratitude and understanding of all discussed

## 2024-08-09 NOTE — Addendum Note (Signed)
 Addended by: GAYLAND LAURAINE PARAS on: 08/09/2024 07:49 AM   Modules accepted: Orders

## 2024-08-23 ENCOUNTER — Telehealth: Payer: Self-pay | Admitting: Neurology

## 2024-08-23 DIAGNOSIS — K859 Acute pancreatitis without necrosis or infection, unspecified: Secondary | ICD-10-CM

## 2024-08-23 DIAGNOSIS — G40309 Generalized idiopathic epilepsy and epileptic syndromes, not intractable, without status epilepticus: Secondary | ICD-10-CM

## 2024-08-23 DIAGNOSIS — R1900 Intra-abdominal and pelvic swelling, mass and lump, unspecified site: Secondary | ICD-10-CM

## 2024-08-23 MED ORDER — LEVETIRACETAM 500 MG PO TABS: 500.0000 mg | ORAL_TABLET | Freq: Two times a day (BID) | ORAL | Status: AC

## 2024-08-23 NOTE — Telephone Encounter (Signed)
 Lvm 1st attempt by hf 08/23/24. Should he call back please let him know to complete labs. Orders were released so [place] should be able to see them. In the case they cannot see them, let pt know not to leave and provide the fax # while there to draw labs and I will get those faxed immediately

## 2024-08-23 NOTE — Telephone Encounter (Signed)
 Returned call to pt who stated that they had sz at 1:15am. Pt denied missing meds. Pt stated that the sz was convulsive but not as bad as one in past. Pt denied hurting himself in anyway.

## 2024-08-23 NOTE — Telephone Encounter (Signed)
 Pt Mother called returning call Informed Of CMA message, They will go to [place] [facility] when road clear up . Pt mother requested  to give call back due to question she has  for MD

## 2024-08-23 NOTE — Addendum Note (Signed)
 Addended by: GAYLAND LAURAINE PARAS on: 08/23/2024 04:22 PM   Modules accepted: Orders

## 2024-09-05 ENCOUNTER — Ambulatory Visit: Admitting: Gastroenterology

## 2024-11-24 ENCOUNTER — Ambulatory Visit: Admitting: Neurology

## 2024-12-05 ENCOUNTER — Ambulatory Visit: Admitting: Neurology

## 2024-12-22 ENCOUNTER — Ambulatory Visit: Admitting: Neurology
